# Patient Record
Sex: Male | Born: 1950 | Race: White | Hispanic: No | Marital: Married | Smoking: Former smoker
Health system: Southern US, Community
[De-identification: ages and names within clinical notes are randomized; demographics above are authoritative.]

---

## 1998-08-28 ENCOUNTER — Ambulatory Visit: Admission: RE | Admit: 1998-08-28 | Discharge: 1998-08-28 | Payer: Self-pay | Admitting: Occupational Medicine

## 1998-08-28 ENCOUNTER — Encounter: Payer: Self-pay | Admitting: Occupational Medicine

## 2010-07-11 ENCOUNTER — Emergency Department (HOSPITAL_COMMUNITY): Payer: Worker's Compensation

## 2010-07-11 ENCOUNTER — Emergency Department (HOSPITAL_COMMUNITY)
Admission: EM | Admit: 2010-07-11 | Discharge: 2010-07-11 | Disposition: A | Discharge status: Home / Self Care | Payer: Worker's Compensation | Attending: Emergency Medicine | Admitting: Emergency Medicine

## 2010-07-11 DIAGNOSIS — M795 Residual foreign body in soft tissue: Secondary | ICD-10-CM | POA: Insufficient documentation

## 2010-07-11 DIAGNOSIS — Z1810 Retained metal fragments, unspecified: Secondary | ICD-10-CM | POA: Insufficient documentation

## 2010-07-11 DIAGNOSIS — Z79899 Other long term (current) drug therapy: Secondary | ICD-10-CM | POA: Insufficient documentation

## 2010-07-11 DIAGNOSIS — M25569 Pain in unspecified knee: Secondary | ICD-10-CM | POA: Insufficient documentation

## 2010-07-11 DIAGNOSIS — E119 Type 2 diabetes mellitus without complications: Secondary | ICD-10-CM | POA: Insufficient documentation

## 2010-07-11 DIAGNOSIS — M25469 Effusion, unspecified knee: Secondary | ICD-10-CM | POA: Insufficient documentation

## 2010-07-11 LAB — CBC
HCT: 44 % (ref 39.0–52.0)
Hemoglobin: 15.1 g/dL (ref 13.0–17.0)
MCH: 31.7 pg (ref 26.0–34.0)
MCHC: 34.3 g/dL (ref 30.0–36.0)
RBC: 4.76 MIL/uL (ref 4.22–5.81)

## 2010-07-11 LAB — DIFFERENTIAL
Basophils Absolute: 0.1 10*3/uL (ref 0.0–0.1)
Basophils Relative: 0 % (ref 0–1)
Eosinophils Absolute: 0.2 10*3/uL (ref 0.0–0.7)
Eosinophils Relative: 1 % (ref 0–5)
Lymphocytes Relative: 20 % (ref 12–46)
Lymphs Abs: 2.8 10*3/uL (ref 0.7–4.0)
Monocytes Absolute: 1.3 10*3/uL — ABNORMAL HIGH (ref 0.1–1.0)
Monocytes Relative: 9 % (ref 3–12)
Neutro Abs: 10 10*3/uL — ABNORMAL HIGH (ref 1.7–7.7)
Neutrophils Relative %: 70 % (ref 43–77)

## 2010-07-11 LAB — POCT I-STAT, CHEM 8
BUN: 21 mg/dL (ref 6–23)
Calcium, Ion: 1.21 mmol/L (ref 1.12–1.32)
Chloride: 107 mEq/L (ref 96–112)
Creatinine, Ser: 1.1 mg/dL (ref 0.4–1.5)
Glucose, Bld: 134 mg/dL — ABNORMAL HIGH (ref 70–99)
HCT: 46 % (ref 39.0–52.0)
Hemoglobin: 15.6 g/dL (ref 13.0–17.0)
Potassium: 4.2 mEq/L (ref 3.5–5.1)
Sodium: 141 mEq/L (ref 135–145)
TCO2: 24 mmol/L (ref 0–100)

## 2010-07-24 NOTE — H&P (Signed)
  NAME:  VERNA, HAMON NO.:  [id number]  MEDICAL RECORD NO.:  [id number]           PATIENT TYPE:  E  LOCATION:  [facility]                         FACILITY:  [facility]  PHYSICIAN:  Burnard Bunting, M.D.    DATE OF BIRTH:  06/30/1950  DATE OF ADMISSION:  07/11/2010 DATE OF DISCHARGE:  07/11/2010                             HISTORY & PHYSICAL   REQUESTING PHYSICIAN:  Devoria Albe, MD  CHIEF COMPLAINT:  Right knee pain.  HISTORY OF PRESENT ILLNESS:  Steven Perry is a 60 year old patient who was at work when he was working on hammering a ball bearing and a piece of metal pop off and flew into his distal thigh on the right-hand side. He has been having some mild pain.  He is able to weight bear.  His current medications are Lipitor, metformin.  He is allergic to ASPIRIN. All other systems are reviewed and are negative as they relate to the right knee.  The patient is nonsmoker.  Occasionally drinks.  PHYSICAL EXAMINATION:  GENERAL:  He is well developed and well nourished, in acute distress.  Alert and oriented.  Answers questions appropriately. VITAL SIGNS:  Temperature 98.3, blood pressure 151/82, respirations 16. EXTREMITIES:  Right knee is examined.  There is no effusion or claudication.  Ligaments are stable.  There is an entry wound over the VMO about 4 cm proximal to the tip of the patella.  The radiographs 2-views right knee reviewed foreign body is about 6-7 cm proximal to the superior pole of patella.  Pedal pulses are palpable. Ankle dorsiflexion and plantar flexion is intact.  IMPRESSION:  Foreign body lodged in soft tissue.  Does not appear to have gone through the knee joint.  There is no effusion in the knee joint.  He does have a hematoma around the entry wound.  He is in a bit of risk of infection, especially with diabetes.  Plan at this time is for observation.  I am going to wrap the knee with compression, ice it down 20 minutes 3 times a day.  As it had  been said that he is allergic to aspirin, we will hold off on the anti-inflammatories, put him instead on pain medicine and muscle relaxers.  Out of work for a week until we see him back in our [facility] with Dr. Ophelia Charter.  All questions answered.     Burnard Bunting, M.D.     GSD/MEDQ  D:  07/11/2010  T:  07/12/2010  Job:  [id number]  Electronically Signed by Reece Agar.  Ananiah Maciolek M.D. on 07/24/2010 03:20:28 PM

## 2017-09-14 DIAGNOSIS — N183 Chronic kidney disease, stage 3 (moderate): Secondary | ICD-10-CM | POA: Diagnosis not present

## 2017-09-14 DIAGNOSIS — Z720 Tobacco use: Secondary | ICD-10-CM | POA: Diagnosis not present

## 2017-09-14 DIAGNOSIS — E1129 Type 2 diabetes mellitus with other diabetic kidney complication: Secondary | ICD-10-CM | POA: Diagnosis not present

## 2017-09-14 DIAGNOSIS — E1122 Type 2 diabetes mellitus with diabetic chronic kidney disease: Secondary | ICD-10-CM | POA: Diagnosis not present

## 2017-09-14 DIAGNOSIS — E782 Mixed hyperlipidemia: Secondary | ICD-10-CM | POA: Diagnosis not present

## 2017-10-01 DIAGNOSIS — L578 Other skin changes due to chronic exposure to nonionizing radiation: Secondary | ICD-10-CM | POA: Diagnosis not present

## 2017-10-01 DIAGNOSIS — L821 Other seborrheic keratosis: Secondary | ICD-10-CM | POA: Diagnosis not present

## 2017-10-01 DIAGNOSIS — L570 Actinic keratosis: Secondary | ICD-10-CM | POA: Diagnosis not present

## 2017-10-01 DIAGNOSIS — C44212 Basal cell carcinoma of skin of right ear and external auricular canal: Secondary | ICD-10-CM | POA: Diagnosis not present

## 2017-11-27 DIAGNOSIS — Z23 Encounter for immunization: Secondary | ICD-10-CM | POA: Diagnosis not present

## 2017-12-17 DIAGNOSIS — Z79899 Other long term (current) drug therapy: Secondary | ICD-10-CM | POA: Diagnosis not present

## 2017-12-17 DIAGNOSIS — M19042 Primary osteoarthritis, left hand: Secondary | ICD-10-CM | POA: Diagnosis not present

## 2017-12-17 DIAGNOSIS — M19041 Primary osteoarthritis, right hand: Secondary | ICD-10-CM | POA: Diagnosis not present

## 2017-12-17 DIAGNOSIS — E782 Mixed hyperlipidemia: Secondary | ICD-10-CM | POA: Diagnosis not present

## 2017-12-17 DIAGNOSIS — Z23 Encounter for immunization: Secondary | ICD-10-CM | POA: Diagnosis not present

## 2017-12-17 DIAGNOSIS — E1121 Type 2 diabetes mellitus with diabetic nephropathy: Secondary | ICD-10-CM | POA: Diagnosis not present

## 2017-12-17 DIAGNOSIS — N183 Chronic kidney disease, stage 3 (moderate): Secondary | ICD-10-CM | POA: Diagnosis not present

## 2018-03-25 DIAGNOSIS — E782 Mixed hyperlipidemia: Secondary | ICD-10-CM | POA: Diagnosis not present

## 2018-03-25 DIAGNOSIS — E1121 Type 2 diabetes mellitus with diabetic nephropathy: Secondary | ICD-10-CM | POA: Diagnosis not present

## 2018-03-25 DIAGNOSIS — N183 Chronic kidney disease, stage 3 (moderate): Secondary | ICD-10-CM | POA: Diagnosis not present

## 2018-03-25 DIAGNOSIS — J449 Chronic obstructive pulmonary disease, unspecified: Secondary | ICD-10-CM | POA: Diagnosis not present

## 2018-03-25 DIAGNOSIS — Z0000 Encounter for general adult medical examination without abnormal findings: Secondary | ICD-10-CM | POA: Diagnosis not present

## 2018-03-25 DIAGNOSIS — Z79899 Other long term (current) drug therapy: Secondary | ICD-10-CM | POA: Diagnosis not present

## 2018-04-10 DIAGNOSIS — N183 Chronic kidney disease, stage 3 (moderate): Secondary | ICD-10-CM | POA: Diagnosis not present

## 2018-04-10 DIAGNOSIS — J449 Chronic obstructive pulmonary disease, unspecified: Secondary | ICD-10-CM | POA: Diagnosis not present

## 2018-04-10 DIAGNOSIS — E1121 Type 2 diabetes mellitus with diabetic nephropathy: Secondary | ICD-10-CM | POA: Diagnosis not present

## 2018-05-11 DIAGNOSIS — N183 Chronic kidney disease, stage 3 (moderate): Secondary | ICD-10-CM | POA: Diagnosis not present

## 2018-05-11 DIAGNOSIS — E782 Mixed hyperlipidemia: Secondary | ICD-10-CM | POA: Diagnosis not present

## 2018-06-08 DIAGNOSIS — N183 Chronic kidney disease, stage 3 (moderate): Secondary | ICD-10-CM | POA: Diagnosis not present

## 2018-06-08 DIAGNOSIS — I129 Hypertensive chronic kidney disease with stage 1 through stage 4 chronic kidney disease, or unspecified chronic kidney disease: Secondary | ICD-10-CM | POA: Diagnosis not present

## 2018-06-08 DIAGNOSIS — Z720 Tobacco use: Secondary | ICD-10-CM | POA: Diagnosis not present

## 2018-06-08 DIAGNOSIS — E1122 Type 2 diabetes mellitus with diabetic chronic kidney disease: Secondary | ICD-10-CM | POA: Diagnosis not present

## 2018-06-08 DIAGNOSIS — E782 Mixed hyperlipidemia: Secondary | ICD-10-CM | POA: Diagnosis not present

## 2018-07-10 DIAGNOSIS — F419 Anxiety disorder, unspecified: Secondary | ICD-10-CM | POA: Diagnosis not present

## 2018-07-10 DIAGNOSIS — E782 Mixed hyperlipidemia: Secondary | ICD-10-CM | POA: Diagnosis not present

## 2018-10-26 DIAGNOSIS — Z23 Encounter for immunization: Secondary | ICD-10-CM | POA: Diagnosis not present

## 2018-11-17 DIAGNOSIS — N183 Chronic kidney disease, stage 3 unspecified: Secondary | ICD-10-CM | POA: Diagnosis not present

## 2018-11-17 DIAGNOSIS — Z79899 Other long term (current) drug therapy: Secondary | ICD-10-CM | POA: Diagnosis not present

## 2018-11-17 DIAGNOSIS — E1121 Type 2 diabetes mellitus with diabetic nephropathy: Secondary | ICD-10-CM | POA: Diagnosis not present

## 2018-11-17 DIAGNOSIS — E782 Mixed hyperlipidemia: Secondary | ICD-10-CM | POA: Diagnosis not present

## 2018-11-17 DIAGNOSIS — Z6824 Body mass index (BMI) 24.0-24.9, adult: Secondary | ICD-10-CM | POA: Diagnosis not present

## 2018-11-17 DIAGNOSIS — I739 Peripheral vascular disease, unspecified: Secondary | ICD-10-CM | POA: Diagnosis not present

## 2019-01-18 DIAGNOSIS — E1122 Type 2 diabetes mellitus with diabetic chronic kidney disease: Secondary | ICD-10-CM | POA: Diagnosis not present

## 2019-01-18 DIAGNOSIS — E782 Mixed hyperlipidemia: Secondary | ICD-10-CM | POA: Diagnosis not present

## 2019-01-18 DIAGNOSIS — I129 Hypertensive chronic kidney disease with stage 1 through stage 4 chronic kidney disease, or unspecified chronic kidney disease: Secondary | ICD-10-CM | POA: Diagnosis not present

## 2019-01-18 DIAGNOSIS — N183 Chronic kidney disease, stage 3 unspecified: Secondary | ICD-10-CM | POA: Diagnosis not present

## 2019-01-18 DIAGNOSIS — Z720 Tobacco use: Secondary | ICD-10-CM | POA: Diagnosis not present

## 2019-04-18 DIAGNOSIS — Z0000 Encounter for general adult medical examination without abnormal findings: Secondary | ICD-10-CM | POA: Diagnosis not present

## 2019-04-18 DIAGNOSIS — F419 Anxiety disorder, unspecified: Secondary | ICD-10-CM | POA: Diagnosis not present

## 2019-04-18 DIAGNOSIS — E782 Mixed hyperlipidemia: Secondary | ICD-10-CM | POA: Diagnosis not present

## 2019-04-18 DIAGNOSIS — J449 Chronic obstructive pulmonary disease, unspecified: Secondary | ICD-10-CM | POA: Diagnosis not present

## 2019-04-18 DIAGNOSIS — N183 Chronic kidney disease, stage 3 unspecified: Secondary | ICD-10-CM | POA: Diagnosis not present

## 2019-04-18 DIAGNOSIS — E1121 Type 2 diabetes mellitus with diabetic nephropathy: Secondary | ICD-10-CM | POA: Diagnosis not present

## 2019-04-18 DIAGNOSIS — Z79899 Other long term (current) drug therapy: Secondary | ICD-10-CM | POA: Diagnosis not present

## 2019-04-21 DIAGNOSIS — Z23 Encounter for immunization: Secondary | ICD-10-CM | POA: Diagnosis not present

## 2019-05-26 DIAGNOSIS — Z23 Encounter for immunization: Secondary | ICD-10-CM | POA: Diagnosis not present

## 2019-07-22 DIAGNOSIS — J209 Acute bronchitis, unspecified: Secondary | ICD-10-CM | POA: Diagnosis not present

## 2019-09-27 DIAGNOSIS — N183 Chronic kidney disease, stage 3 unspecified: Secondary | ICD-10-CM | POA: Diagnosis not present

## 2019-09-27 DIAGNOSIS — E782 Mixed hyperlipidemia: Secondary | ICD-10-CM | POA: Diagnosis not present

## 2019-09-27 DIAGNOSIS — Z79899 Other long term (current) drug therapy: Secondary | ICD-10-CM | POA: Diagnosis not present

## 2019-09-27 DIAGNOSIS — J449 Chronic obstructive pulmonary disease, unspecified: Secondary | ICD-10-CM | POA: Diagnosis not present

## 2019-09-27 DIAGNOSIS — E1121 Type 2 diabetes mellitus with diabetic nephropathy: Secondary | ICD-10-CM | POA: Diagnosis not present

## 2019-10-18 DIAGNOSIS — N183 Chronic kidney disease, stage 3 unspecified: Secondary | ICD-10-CM | POA: Diagnosis not present

## 2019-10-18 DIAGNOSIS — E1129 Type 2 diabetes mellitus with other diabetic kidney complication: Secondary | ICD-10-CM | POA: Diagnosis not present

## 2019-10-18 DIAGNOSIS — E1122 Type 2 diabetes mellitus with diabetic chronic kidney disease: Secondary | ICD-10-CM | POA: Diagnosis not present

## 2019-10-18 DIAGNOSIS — Z720 Tobacco use: Secondary | ICD-10-CM | POA: Diagnosis not present

## 2019-10-18 DIAGNOSIS — I129 Hypertensive chronic kidney disease with stage 1 through stage 4 chronic kidney disease, or unspecified chronic kidney disease: Secondary | ICD-10-CM | POA: Diagnosis not present

## 2019-10-18 DIAGNOSIS — E782 Mixed hyperlipidemia: Secondary | ICD-10-CM | POA: Diagnosis not present

## 2019-10-18 DIAGNOSIS — R300 Dysuria: Secondary | ICD-10-CM | POA: Diagnosis not present

## 2019-10-18 DIAGNOSIS — Z87442 Personal history of urinary calculi: Secondary | ICD-10-CM | POA: Diagnosis not present

## 2019-11-15 DIAGNOSIS — Z23 Encounter for immunization: Secondary | ICD-10-CM | POA: Diagnosis not present

## 2020-01-18 DIAGNOSIS — Z23 Encounter for immunization: Secondary | ICD-10-CM | POA: Diagnosis not present

## 2020-03-20 DIAGNOSIS — Z79899 Other long term (current) drug therapy: Secondary | ICD-10-CM | POA: Diagnosis not present

## 2020-03-20 DIAGNOSIS — E782 Mixed hyperlipidemia: Secondary | ICD-10-CM | POA: Diagnosis not present

## 2020-03-20 DIAGNOSIS — E1121 Type 2 diabetes mellitus with diabetic nephropathy: Secondary | ICD-10-CM | POA: Diagnosis not present

## 2020-03-20 DIAGNOSIS — Z125 Encounter for screening for malignant neoplasm of prostate: Secondary | ICD-10-CM | POA: Diagnosis not present

## 2020-04-19 DIAGNOSIS — E1121 Type 2 diabetes mellitus with diabetic nephropathy: Secondary | ICD-10-CM | POA: Diagnosis not present

## 2020-04-19 DIAGNOSIS — R6889 Other general symptoms and signs: Secondary | ICD-10-CM | POA: Diagnosis not present

## 2020-04-19 DIAGNOSIS — J449 Chronic obstructive pulmonary disease, unspecified: Secondary | ICD-10-CM | POA: Diagnosis not present

## 2020-04-19 DIAGNOSIS — I739 Peripheral vascular disease, unspecified: Secondary | ICD-10-CM | POA: Diagnosis not present

## 2020-04-19 DIAGNOSIS — Z0000 Encounter for general adult medical examination without abnormal findings: Secondary | ICD-10-CM | POA: Diagnosis not present

## 2020-05-09 ENCOUNTER — Other Ambulatory Visit: Payer: Self-pay

## 2020-05-09 ENCOUNTER — Ambulatory Visit (INDEPENDENT_AMBULATORY_CARE_PROVIDER_SITE_OTHER): Payer: Medicare Other | Admitting: Cardiovascular Disease

## 2020-05-09 ENCOUNTER — Encounter: Payer: Self-pay | Admitting: Cardiovascular Disease

## 2020-05-09 DIAGNOSIS — Z720 Tobacco use: Secondary | ICD-10-CM | POA: Diagnosis not present

## 2020-05-09 DIAGNOSIS — I739 Peripheral vascular disease, unspecified: Secondary | ICD-10-CM

## 2020-05-09 DIAGNOSIS — E782 Mixed hyperlipidemia: Secondary | ICD-10-CM

## 2020-05-09 DIAGNOSIS — E119 Type 2 diabetes mellitus without complications: Secondary | ICD-10-CM | POA: Insufficient documentation

## 2020-05-09 DIAGNOSIS — I10 Essential (primary) hypertension: Secondary | ICD-10-CM | POA: Diagnosis not present

## 2020-05-09 DIAGNOSIS — E785 Hyperlipidemia, unspecified: Secondary | ICD-10-CM | POA: Insufficient documentation

## 2020-05-09 NOTE — Assessment & Plan Note (Signed)
Patient was referred by Dr. Humphrey Rolls for lifestyle limiting claudication which began 2 to 3 years ago.  Left is worse than right.  We will get lower extremity arterial Doppler studies to further evaluate.

## 2020-05-09 NOTE — Progress Notes (Signed)
05/09/2020 Steven Perry   1950-07-04  [id number]  Primary Physician Mateo Flow, MD Primary Cardiologist: Lorretta Harp MD [name], [name]  HPI:  Steven Perry is a 69 y.o. thin appearing married Caucasian male father of 2 children, grandfather of 2 grandchildren referred by Dr.Jaber Humphrey Rolls in [place] for evaluation treatment of lifestyle limiting claudication.  Patient is accompanied by his wife Steven Perry today.  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  History of hypertension, diabetes and hyperlipidemia.  His mother had stents in her 53s and his brother has had stents as well.  He is never had a heart attack or stroke.  He denies chest pain but does get shortness of breath because of COPD.  He has a serum creatinine that runs in the 1.8 range.  He has had left greater than right lower extremity lifestyle limiting claudication for the last 2 years which is lifestyle limiting.   Current Meds  Medication Sig  . ALPRAZolam (XANAX) 0.5 MG tablet Take 1 tablet by mouth daily.  . budesonide-formoterol (SYMBICORT) 160-4.5 MCG/ACT inhaler Inhale 2 puffs into the lungs 2 (two) times daily.  [name] glimepiride (AMARYL) 2 MG tablet Take 1 tablet by mouth daily.  [name] lisinopril (ZESTRIL) 5 MG tablet Take 1 tablet by mouth daily.  . pioglitazone (ACTOS) 30 MG tablet Take 1 tablet by mouth daily.  . QUEtiapine (SEROQUEL) 50 MG tablet Take 1 tablet by mouth as directed.  . rosuvastatin (CRESTOR) 10 MG tablet Take 1 tablet by mouth daily.  . traMADol-acetaminophen (ULTRACET) 37.5-325 MG tablet Take 1 tablet by mouth every 6 (six) hours as needed.  . [DISCONTINUED] cilostazol (PLETAL) 100 MG tablet Take 100 mg by mouth 2 (two) times daily.     Allergies  Allergen Reactions  . Ibuprofen Itching  . Nsaids Other (See Comments)    Renal insufficiency  . Statins Rash    Social History   Socioeconomic  History  . Marital status: Married    Spouse name: Not on file  . Number of children: Not on file  . Years of education: Not on file  . Highest education level: Not on file  Occupational History  . Not on file  Tobacco Use  . Smoking status: Current Every Day Smoker  . Smokeless tobacco: Never Used  Substance and Sexual Activity  . Alcohol use: Not on file  . Drug use: Not on file  . Sexual activity: Not on file  Other Topics Concern  . Not on file  Social History Narrative  . Not on file   Social Determinants of Health   Financial Resource Strain: Not on file  Food Insecurity: Not on file  Transportation Needs: Not on file  Physical Activity: Not on file  Stress: Not on file  Social Connections: Not on file  Intimate Partner Violence: Not on file     Review of Systems: General: negative for chills, fever, night sweats or weight changes.  Cardiovascular: negative for chest pain, dyspnea on exertion, edema, orthopnea, palpitations, paroxysmal nocturnal dyspnea or shortness of breath Dermatological: negative for rash Respiratory: negative for cough or wheezing Urologic: negative for hematuria Abdominal: negative for nausea, vomiting, diarrhea, bright red blood per rectum, melena, or hematemesis Neurologic: negative for visual changes, syncope, or dizziness All other systems reviewed and are otherwise negative except as noted above.    Blood pressure 136/64,  pulse 74, height 5\' 8"  (1.727 m), weight 159 lb (72.1 kg).  General appearance: alert and no distress Neck: no adenopathy, no carotid bruit, no JVD, supple, symmetrical, trachea midline and thyroid not enlarged, symmetric, no tenderness/mass/nodules Lungs: clear to auscultation bilaterally Heart: regular rate and rhythm, S1, S2 normal, no murmur, click, rub or gallop Extremities: extremities normal, atraumatic, no cyanosis or edema Pulses: Diminished pedal pulses Skin: Skin color, texture, turgor normal. No rashes  or lesions Neurologic: Alert and oriented X 3, normal strength and tone. Normal symmetric reflexes. Normal coordination and gait  EKG normal sinus rhythm at 74 with septal Q waves.  I personally reviewed this EKG.  ASSESSMENT AND PLAN:   Tobacco abuse History of ongoing tobacco abuse 1/2 pack/day for last 50 years.  Essential hypertension History of essential hypertension blood pressure measured today 136/64.  He is on lisinopril.  Hyperlipidemia History of hyperlipidemia on statin therapy with lipid profile performed by his PCP 03/26/2020 revealing total cluster of 235, LDL of 158 and HDL of 38.  Peripheral arterial disease ([place]) Patient was referred by Dr. Humphrey Rolls for lifestyle limiting claudication which began 2 to 3 years ago.  Left is worse than right.  We will get lower extremity arterial Doppler studies to further evaluate.      Lorretta Harp MD FACP,FACC,FAHA, [facility] 05/09/2020 3:42 PM

## 2020-05-09 NOTE — Assessment & Plan Note (Signed)
History of ongoing tobacco abuse 1/2 pack/day for last 50 years.

## 2020-05-09 NOTE — Assessment & Plan Note (Signed)
History of essential hypertension blood pressure measured today 136/64.  He is on lisinopril.

## 2020-05-09 NOTE — Assessment & Plan Note (Signed)
History of hyperlipidemia on statin therapy with lipid profile performed by his PCP 03/26/2020 revealing total cluster of 235, LDL of 158 and HDL of 38.

## 2020-05-09 NOTE — Patient Instructions (Addendum)
Medication Instructions:  Your physician recommends that you continue on your current medications as directed. Please refer to the Current Medication list given to you today.  *If you need a refill on your cardiac medications before your next appointment, please call your pharmacy*   Testing/Procedures: Your physician has requested that you have a lower extremity arterial duplex. This test is an ultrasound of the arteries in the legs. It looks at arterial blood flow in the legs. Allow one hour for Lower Arterial scans. There are no restrictions or special instructions  Your physician has requested that you have an abdominal aorta duplex. During this test, an ultrasound is used to evaluate the aorta. Allow 30 minutes for this exam. Do not eat after midnight the day before and avoid carbonated beverages  Your physician has requested that you have an ankle brachial index (ABI). During this test an ultrasound and blood pressure cuff are used to evaluate the arteries that supply the arms and legs with blood. Allow thirty minutes for this exam. There are no restrictions or special instructions.  These procedures are done at [facility]. 2nd Floor.    Follow-Up: At [facility], you and your health needs are our priority.  As part of our continuing mission to provide you with exceptional heart care, we have created designated Provider Care Teams.  These Care Teams include your primary Cardiologist (physician) and Advanced Practice Providers (APPs -  Physician Assistants and Nurse Practitioners) who all work together to provide you with the care you need, when you need it.  We recommend signing up for the patient portal called "MyChart".  Sign up information is provided on this After Visit Summary.  MyChart is used to connect with patients for Virtual Visits (Telemedicine).  Patients are able to view lab/test results, encounter notes, upcoming appointments, etc.  Non-urgent messages can be sent  to your provider as well.   To learn more about what you can do with MyChart, go to [email or web address].    Your next appointment:   1 month(s)  The format for your next appointment:   In Person  Provider:   [name], MD

## 2020-05-14 DIAGNOSIS — Z1211 Encounter for screening for malignant neoplasm of colon: Secondary | ICD-10-CM | POA: Diagnosis not present

## 2020-05-14 DIAGNOSIS — Z1212 Encounter for screening for malignant neoplasm of rectum: Secondary | ICD-10-CM | POA: Diagnosis not present

## 2020-05-28 ENCOUNTER — Ambulatory Visit (HOSPITAL_BASED_OUTPATIENT_CLINIC_OR_DEPARTMENT_OTHER)
Admission: RE | Admit: 2020-05-28 | Discharge: 2020-05-28 | Disposition: A | Discharge status: Home / Self Care | Payer: Medicare Other | Source: Ambulatory Visit | Attending: Cardiovascular Disease | Admitting: Cardiovascular Disease

## 2020-05-28 ENCOUNTER — Ambulatory Visit (HOSPITAL_COMMUNITY)
Admission: RE | Admit: 2020-05-28 | Discharge: 2020-05-28 | Disposition: A | Discharge status: Home / Self Care | Payer: Medicare Other | Source: Ambulatory Visit | Attending: Cardiovascular Disease | Admitting: Cardiovascular Disease

## 2020-05-28 ENCOUNTER — Other Ambulatory Visit: Payer: Self-pay | Admitting: Cardiovascular Disease

## 2020-05-28 ENCOUNTER — Other Ambulatory Visit: Payer: Self-pay

## 2020-05-28 DIAGNOSIS — M79605 Pain in left leg: Secondary | ICD-10-CM

## 2020-05-28 DIAGNOSIS — I739 Peripheral vascular disease, unspecified: Secondary | ICD-10-CM | POA: Insufficient documentation

## 2020-05-28 DIAGNOSIS — M79604 Pain in right leg: Secondary | ICD-10-CM

## 2020-06-08 ENCOUNTER — Encounter: Payer: Self-pay | Admitting: Cardiovascular Disease

## 2020-06-08 ENCOUNTER — Ambulatory Visit (INDEPENDENT_AMBULATORY_CARE_PROVIDER_SITE_OTHER): Payer: Medicare Other | Admitting: Cardiovascular Disease

## 2020-06-08 ENCOUNTER — Other Ambulatory Visit: Payer: Self-pay

## 2020-06-08 DIAGNOSIS — I739 Peripheral vascular disease, unspecified: Secondary | ICD-10-CM | POA: Diagnosis not present

## 2020-06-08 DIAGNOSIS — Z720 Tobacco use: Secondary | ICD-10-CM | POA: Diagnosis not present

## 2020-06-08 DIAGNOSIS — E782 Mixed hyperlipidemia: Secondary | ICD-10-CM

## 2020-06-08 DIAGNOSIS — I10 Essential (primary) hypertension: Secondary | ICD-10-CM

## 2020-06-08 LAB — LIPID PANEL
Chol/HDL Ratio: 4.1 ratio (ref 0.0–5.0)
Cholesterol, Total: 172 mg/dL (ref 100–199)
HDL: 42 mg/dL (ref >39)
LDL Chol Calc (NIH): 107 mg/dL — ABNORMAL HIGH (ref 0–99)
Triglycerides: 128 mg/dL (ref 0–149)
VLDL Cholesterol Cal: 23 mg/dL (ref 5–40)

## 2020-06-08 LAB — CBC
Hematocrit: 42.8 % (ref 37.5–51.0)
Hemoglobin: 14.2 g/dL (ref 13.0–17.7)
MCH: 31.1 pg (ref 26.6–33.0)
MCHC: 33.2 g/dL (ref 31.5–35.7)
MCV: 94 fL (ref 79–97)
Platelets: 293 10*3/uL (ref 150–450)
RBC: 4.56 x10E6/uL (ref 4.14–5.80)
RDW: 12.8 % (ref 11.6–15.4)
WBC: 10.5 10*3/uL (ref 3.4–10.8)

## 2020-06-08 LAB — BASIC METABOLIC PANEL
BUN/Creatinine Ratio: 11 (ref 10–24)
BUN: 21 mg/dL (ref 8–27)
CO2: 23 mmol/L (ref 20–29)
Calcium: 9.1 mg/dL (ref 8.6–10.2)
Chloride: 102 mmol/L (ref 96–106)
Creatinine, Ser: 1.92 mg/dL — ABNORMAL HIGH (ref 0.76–1.27)
Glucose: 153 mg/dL — ABNORMAL HIGH (ref 65–99)
Potassium: 4.4 mmol/L (ref 3.5–5.2)
Sodium: 138 mmol/L (ref 134–144)
eGFR: 37 mL/min/{1.73_m2} — ABNORMAL LOW (ref >59)

## 2020-06-08 LAB — HEPATIC FUNCTION PANEL
ALT: 14 IU/L (ref 0–44)
AST: 14 IU/L (ref 0–40)
Albumin: 4.4 g/dL (ref 3.8–4.8)
Alkaline Phosphatase: 73 IU/L (ref 44–121)
Bilirubin Total: 0.2 mg/dL (ref 0.0–1.2)
Bilirubin, Direct: <0.1 mg/dL (ref 0.00–0.40)
Total Protein: 7.1 g/dL (ref 6.0–8.5)

## 2020-06-08 MED ORDER — SODIUM CHLORIDE 0.9% FLUSH: 3.0000 mL | Freq: Two times a day (BID) | INTRAVENOUS | Status: AC

## 2020-06-08 NOTE — Assessment & Plan Note (Signed)
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  His ABI was 0.99 on the right and 0.69 on the left.  He will need peripheral angiography and intervention.

## 2020-06-08 NOTE — Patient Instructions (Addendum)
Steven Perry [place] [address] Dept: [phone] Loc: [place]  06/08/2020  You are scheduled for a Peripheral Angiogram on Thursday, May 5 with Dr. [name].  1. Please arrive at the [facility] (Main Entrance A) at [facility]: [address] [place], [place] [address] at 5:30 AM (This time is two hours before your procedure to ensure your preparation). Free valet parking service is available.   Special note: Every effort is made to have your procedure done on time. Please understand that emergencies sometimes delay scheduled procedures.  2. Diet: Do not eat solid foods after midnight.  The patient may have clear liquids until 5am upon the day of the procedure.  3. Labs: You will need to have blood drawn today (06/08/20).  4. Medication instructions in preparation for your procedure:  -Stop taking Lisinopril 3 days prior to procedure, last dose Sunday, May 1  On the morning of your procedure, take your Aspirin and any morning medicines NOT listed above.  You may use sips of water.  5. Plan for one night stay--bring personal belongings. 6. Bring a current list of your medications and current insurance cards. 7. You MUST have a responsible person to drive you home. 8. Someone MUST be with you the first 24 hours after you arrive home or your discharge will be delayed. 9. Please wear clothes that are easy to get on and off and wear slip-on shoes.  Thank you for allowing [place] to care for you!   --  Invasive Cardiovascular services  You will need aorta/IVC/Iliac, Lower extremity and ABIs 1 week after procedure.  You will need a 2 week follow up with Dr. Gwenlyn Found.    Dr. Gwenlyn Found has ordered a CT coronary calcium score. This test is done at [address] 3rd Floor. This is $99 out of pocket.   Coronary CalciumScan A coronary  calcium scan is an imaging test used to look for deposits of calcium and other fatty materials (plaques) in the inner lining of the blood vessels of the heart (coronary arteries). These deposits of calcium and plaques can partly clog and narrow the coronary arteries without producing any symptoms or warning signs. This puts a person at risk for a heart attack. This test can detect these deposits before symptoms develop. Tell a health care provider about:  Any allergies you have.  All medicines you are taking, including vitamins, herbs, eye drops, creams, and over-the-counter medicines.  Any problems you or family members have had with anesthetic medicines.  Any blood disorders you have.  Any surgeries you have had.  Any medical conditions you have.  Whether you are pregnant or may be pregnant. What are the risks? Generally, this is a safe procedure. However, problems may occur, including:  Harm to a pregnant woman and her unborn baby. This test involves the use of radiation. Radiation exposure can be dangerous to a pregnant woman and her unborn baby. If you are pregnant, you generally should not have this procedure done.  Slight increase in the risk of cancer. This is because of the radiation involved in the test. What happens before the procedure? No preparation is needed for this procedure. What happens during the procedure?  You will undress and remove any jewelry around your neck or chest.  You will put on a hospital gown.  Sticky electrodes will be placed on your  chest. The electrodes will be connected to an electrocardiogram (ECG) machine to record a tracing of the electrical activity of your heart.  A CT scanner will take pictures of your heart. During this time, you will be asked to lie still and hold your breath for 2-3 seconds while a picture of your heart is being taken. The procedure may vary among health care providers and hospitals. What happens after the  procedure?  You can get dressed.  You can return to your normal activities.  It is up to you to get the results of your test. Ask your health care provider, or the department that is doing the test, when your results will be ready. Summary  A coronary calcium scan is an imaging test used to look for deposits of calcium and other fatty materials (plaques) in the inner lining of the blood vessels of the heart (coronary arteries).  Generally, this is a safe procedure. Tell your health care provider if you are pregnant or may be pregnant.  No preparation is needed for this procedure.  A CT scanner will take pictures of your heart.  You can return to your normal activities after the scan is done. This information is not intended to replace advice given to you by your health care provider. Make sure you discuss any questions you have with your health care provider. Document Released: 07/26/2007 Document Revised: 12/17/2015 Document Reviewed: 12/17/2015 Elsevier Interactive Patient Education  2017 [employer].

## 2020-06-08 NOTE — Progress Notes (Signed)
06/08/2020 Steven Perry   1950/07/08  [id number]  Primary Physician Steven Flow, MD Primary Cardiologist: Steven Harp MD Steven Perry, [name]  HPI:  Steven Perry is a 70 y.o.  thin appearing married Caucasian male father of 2 children, grandfather of 2 grandchildren referred by Dr.Jaber Humphrey Perry in [place] for evaluation treatment of lifestyle limiting claudication.  Patient is accompanied by his wife Steven Perry today.    I last saw him in the office 05/09/2020.  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  History of hypertension, diabetes and hyperlipidemia.  His mother had stents in her 79s and his brother has had stents as well.  He is never had a heart attack or stroke.  He denies chest pain but does get shortness of breath because of COPD.  He has a serum creatinine that runs in the 1.8 range.  He has had left greater than right lower extremity lifestyle limiting claudication for the last 2 years which is lifestyle limiting.  I performed lower extremity arterial Doppler studies on him 05/28/2020 revealing a right ABI of 0.99 and a left of 0.69.  He did have a high-frequency signal in his left common iliac artery.  He wishes to proceed with peripheral angiography and intervention for lifestyle limiting claudication.   Current Meds  Medication Sig  . ALPRAZolam (XANAX) 0.5 MG tablet Take 1 tablet by mouth daily.  . budesonide-formoterol (SYMBICORT) 160-4.5 MCG/ACT inhaler Inhale 2 puffs into the lungs 2 (two) times daily.  [name] glimepiride (AMARYL) 2 MG tablet Take 1 tablet by mouth daily.  [name] lisinopril (ZESTRIL) 5 MG tablet Take 1 tablet by mouth daily.  . pioglitazone (ACTOS) 30 MG tablet Take 1 tablet by mouth daily.  . QUEtiapine (SEROQUEL) 50 MG tablet Take 1 tablet by mouth as directed.  . rosuvastatin (CRESTOR) 10 MG tablet Take 1 tablet by mouth daily.  . traMADol-acetaminophen (ULTRACET)  37.5-325 MG tablet Take 1 tablet by mouth every 6 (six) hours as needed.     Allergies  Allergen Reactions  . Ibuprofen Itching  . Nsaids Other (See Comments)    Renal insufficiency  . Statins Rash    Social History   Socioeconomic History  . Marital status: Married    Spouse name: Not on file  . Number of children: Not on file  . Years of education: Not on file  . Highest education level: Not on file  Occupational History  . Not on file  Tobacco Use  . Smoking status: Current Every Day Smoker  . Smokeless tobacco: Never Used  Substance and Sexual Activity  . Alcohol use: Not on file  . Drug use: Not on file  . Sexual activity: Not on file  Other Topics Concern  . Not on file  Social History Narrative  . Not on file   Social Determinants of Health   Financial Resource Strain: Not on file  Food Insecurity: Not on file  Transportation Needs: Not on file  Physical Activity: Not on file  Stress: Not on file  Social Connections: Not on file  Intimate Partner Violence: Not on file     Review of Systems: General: negative for chills, fever, night sweats or weight changes.  Cardiovascular: negative for chest pain, dyspnea on exertion, edema, orthopnea, palpitations, paroxysmal nocturnal dyspnea or shortness of breath Dermatological: negative for rash Respiratory: negative for cough or wheezing  Urologic: negative for hematuria Abdominal: negative for nausea, vomiting, diarrhea, bright red blood per rectum, melena, or hematemesis Neurologic: negative for visual changes, syncope, or dizziness All other systems reviewed and are otherwise negative except as noted above.    Blood pressure (!) 150/61, pulse 71, height 5\' 8"  (1.727 m), weight 158 lb (71.7 kg), SpO2 97 %.  General appearance: alert and no distress Neck: no adenopathy, no carotid bruit, no JVD, supple, symmetrical, trachea midline and thyroid not enlarged, symmetric, no tenderness/mass/nodules Lungs: clear to  auscultation bilaterally Heart: regular rate and rhythm, S1, S2 normal, no murmur, click, rub or gallop Extremities: extremities normal, atraumatic, no cyanosis or edema Pulses: 2+ and symmetric Skin: Skin color, texture, turgor normal. No rashes or lesions Neurologic: Alert and oriented X 3, normal strength and tone. Normal symmetric reflexes. Normal coordination and gait  EKG not performed today  ASSESSMENT AND PLAN:   Tobacco abuse History of ongoing tobacco abuse recalcitrant to risk factor modification.  We had a long discussion about the importance of tobacco cessation.  Essential hypertension History of essential hypertension a blood pressure measured today 150/61.  He is on lisinopril.  Hyperlipidemia History of hyperlipidemia on low-dose rosuvastatin with lipid profile performed 03/20/2020 revealing total cholesterol 235.  He does admit to dietary indiscretion.  We will recheck a lipid liver profile.  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  His ABI was 0.99 on the right and 0.69 on the left.  He will need peripheral angiography and intervention.      Steven Harp MD FACP,FACC,FAHA, [facility] 06/08/2020 9:03 AM

## 2020-06-08 NOTE — Assessment & Plan Note (Signed)
History of essential hypertension a blood pressure measured today 150/61.  He is on lisinopril.

## 2020-06-08 NOTE — H&P (View-Only) (Signed)
06/08/2020 Steven Perry   1950/08/07  [id number]  Primary Physician Mateo Flow, MD Primary Cardiologist: Lorretta Harp MD [name], [name]  HPI:  Steven Perry is a 70 y.o.  thin appearing married Caucasian male father of 2 children, grandfather of 2 grandchildren referred by Dr.Jaber Humphrey Rolls in [place] for evaluation treatment of lifestyle limiting claudication.  Patient is accompanied by his wife Steven Perry today.    I last saw him in the office 05/09/2020.  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  History of hypertension, diabetes and hyperlipidemia.  His mother had stents in her 7s and his brother has had stents as well.  He is never had a heart attack or stroke.  He denies chest pain but does get shortness of breath because of COPD.  He has a serum creatinine that runs in the 1.8 range.  He has had left greater than right lower extremity lifestyle limiting claudication for the last 2 years which is lifestyle limiting.  I performed lower extremity arterial Doppler studies on him 05/28/2020 revealing a right ABI of 0.99 and a left of 0.69.  He did have a high-frequency signal in his left common iliac artery.  He wishes to proceed with peripheral angiography and intervention for lifestyle limiting claudication.   Current Meds  Medication Sig  . ALPRAZolam (XANAX) 0.5 MG tablet Take 1 tablet by mouth daily.  . budesonide-formoterol (SYMBICORT) 160-4.5 MCG/ACT inhaler Inhale 2 puffs into the lungs 2 (two) times daily.  [name] glimepiride (AMARYL) 2 MG tablet Take 1 tablet by mouth daily.  [name] lisinopril (ZESTRIL) 5 MG tablet Take 1 tablet by mouth daily.  . pioglitazone (ACTOS) 30 MG tablet Take 1 tablet by mouth daily.  . QUEtiapine (SEROQUEL) 50 MG tablet Take 1 tablet by mouth as directed.  . rosuvastatin (CRESTOR) 10 MG tablet Take 1 tablet by mouth daily.  . traMADol-acetaminophen (ULTRACET)  37.5-325 MG tablet Take 1 tablet by mouth every 6 (six) hours as needed.     Allergies  Allergen Reactions  . Ibuprofen Itching  . Nsaids Other (See Comments)    Renal insufficiency  . Statins Rash    Social History   Socioeconomic History  . Marital status: Married    Spouse name: Not on file  . Number of children: Not on file  . Years of education: Not on file  . Highest education level: Not on file  Occupational History  . Not on file  Tobacco Use  . Smoking status: Current Every Day Smoker  . Smokeless tobacco: Never Used  Substance and Sexual Activity  . Alcohol use: Not on file  . Drug use: Not on file  . Sexual activity: Not on file  Other Topics Concern  . Not on file  Social History Narrative  . Not on file   Social Determinants of Health   Financial Resource Strain: Not on file  Food Insecurity: Not on file  Transportation Needs: Not on file  Physical Activity: Not on file  Stress: Not on file  Social Connections: Not on file  Intimate Partner Violence: Not on file     Review of Systems: General: negative for chills, fever, night sweats or weight changes.  Cardiovascular: negative for chest pain, dyspnea on exertion, edema, orthopnea, palpitations, paroxysmal nocturnal dyspnea or shortness of breath Dermatological: negative for rash Respiratory: negative for cough or wheezing  Urologic: negative for hematuria Abdominal: negative for nausea, vomiting, diarrhea, bright red blood per rectum, melena, or hematemesis Neurologic: negative for visual changes, syncope, or dizziness All other systems reviewed and are otherwise negative except as noted above.    Blood pressure (!) 150/61, pulse 71, height 5\' 8"  (1.727 m), weight 158 lb (71.7 kg), SpO2 97 %.  General appearance: alert and no distress Neck: no adenopathy, no carotid bruit, no JVD, supple, symmetrical, trachea midline and thyroid not enlarged, symmetric, no tenderness/mass/nodules Lungs: clear to  auscultation bilaterally Heart: regular rate and rhythm, S1, S2 normal, no murmur, click, rub or gallop Extremities: extremities normal, atraumatic, no cyanosis or edema Pulses: 2+ and symmetric Skin: Skin color, texture, turgor normal. No rashes or lesions Neurologic: Alert and oriented X 3, normal strength and tone. Normal symmetric reflexes. Normal coordination and gait  EKG not performed today  ASSESSMENT AND PLAN:   Tobacco abuse History of ongoing tobacco abuse recalcitrant to risk factor modification.  We had a long discussion about the importance of tobacco cessation.  Essential hypertension History of essential hypertension a blood pressure measured today 150/61.  He is on lisinopril.  Hyperlipidemia History of hyperlipidemia on low-dose rosuvastatin with lipid profile performed 03/20/2020 revealing total cholesterol 235.  He does admit to dietary indiscretion.  We will recheck a lipid liver profile.  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  His ABI was 0.99 on the right and 0.69 on the left.  He will need peripheral angiography and intervention.      Lorretta Harp MD FACP,FACC,FAHA, [facility] 06/08/2020 9:03 AM

## 2020-06-08 NOTE — Assessment & Plan Note (Signed)
History of hyperlipidemia on low-dose rosuvastatin with lipid profile performed 03/20/2020 revealing total cholesterol 235.  He does admit to dietary indiscretion.  We will recheck a lipid liver profile.

## 2020-06-08 NOTE — Assessment & Plan Note (Signed)
History of ongoing tobacco abuse recalcitrant to risk factor modification.  We had a long discussion about the importance of tobacco cessation.

## 2020-06-12 ENCOUNTER — Other Ambulatory Visit (HOSPITAL_COMMUNITY)
Admission: RE | Admit: 2020-06-12 | Discharge: 2020-06-12 | Disposition: A | Discharge status: Home / Self Care | Payer: Medicare Other | Source: Ambulatory Visit | Attending: Cardiovascular Disease | Admitting: Cardiovascular Disease

## 2020-06-12 DIAGNOSIS — Z01812 Encounter for preprocedural laboratory examination: Secondary | ICD-10-CM | POA: Insufficient documentation

## 2020-06-12 DIAGNOSIS — Z20822 Contact with and (suspected) exposure to covid-19: Secondary | ICD-10-CM | POA: Diagnosis not present

## 2020-06-13 ENCOUNTER — Telehealth: Payer: Self-pay | Admitting: *Deleted

## 2020-06-13 LAB — SARS CORONAVIRUS 2 (TAT 6-24 HRS): SARS Coronavirus 2: NEGATIVE

## 2020-06-13 NOTE — Telephone Encounter (Signed)
Pt contacted pre-abdominal aortogram  scheduled at [facility] for: Thursday Jun 14, 2020 9:30 AM Verified arrival time and place: [facility]) at: 5:30 AM-pre-procedure hydration   No solid food after midnight prior to cath, clear liquids until 5 AM day of procedure.  Hold: Lisinopril-3 days prior to procedure Actos-AM of procedure Amaryl-AM of procedure  Except hold medications AM meds can be  taken pre-cath with sips of water including: ASA 81 mg-pt reports he does tolerate aspirin   Confirmed patient has responsible adult to drive home post procedure and be with patient first 24 hours after arriving home: yes  You are allowed ONE visitor in the waiting room during the time you are at the hospital for your procedure. Both you and your visitor must wear a mask once you enter the hospital.        Reviewed procedure/mask/visitor instructions with patient.

## 2020-06-14 ENCOUNTER — Encounter (HOSPITAL_COMMUNITY): Payer: Self-pay | Admitting: Cardiovascular Disease

## 2020-06-14 ENCOUNTER — Other Ambulatory Visit: Payer: Self-pay

## 2020-06-14 ENCOUNTER — Ambulatory Visit (HOSPITAL_COMMUNITY)
Admission: RE | Admit: 2020-06-14 | Discharge: 2020-06-15 | Disposition: A | Discharge status: Home / Self Care | Payer: Medicare Other | Attending: Cardiovascular Disease | Admitting: Cardiovascular Disease

## 2020-06-14 ENCOUNTER — Encounter (HOSPITAL_COMMUNITY)
Admission: RE | Disposition: A | Discharge status: Home / Self Care | Payer: Self-pay | Source: Home / Self Care | Attending: Cardiovascular Disease

## 2020-06-14 DIAGNOSIS — E1122 Type 2 diabetes mellitus with diabetic chronic kidney disease: Secondary | ICD-10-CM | POA: Insufficient documentation

## 2020-06-14 DIAGNOSIS — Z7902 Long term (current) use of antithrombotics/antiplatelets: Secondary | ICD-10-CM | POA: Insufficient documentation

## 2020-06-14 DIAGNOSIS — E1151 Type 2 diabetes mellitus with diabetic peripheral angiopathy without gangrene: Secondary | ICD-10-CM | POA: Insufficient documentation

## 2020-06-14 DIAGNOSIS — I70213 Atherosclerosis of native arteries of extremities with intermittent claudication, bilateral legs: Secondary | ICD-10-CM | POA: Diagnosis not present

## 2020-06-14 DIAGNOSIS — N1832 Chronic kidney disease, stage 3b: Secondary | ICD-10-CM | POA: Diagnosis not present

## 2020-06-14 DIAGNOSIS — Z7982 Long term (current) use of aspirin: Secondary | ICD-10-CM | POA: Diagnosis not present

## 2020-06-14 DIAGNOSIS — Z7951 Long term (current) use of inhaled steroids: Secondary | ICD-10-CM | POA: Diagnosis not present

## 2020-06-14 DIAGNOSIS — Z79899 Other long term (current) drug therapy: Secondary | ICD-10-CM | POA: Insufficient documentation

## 2020-06-14 DIAGNOSIS — F17210 Nicotine dependence, cigarettes, uncomplicated: Secondary | ICD-10-CM | POA: Insufficient documentation

## 2020-06-14 DIAGNOSIS — E785 Hyperlipidemia, unspecified: Secondary | ICD-10-CM | POA: Diagnosis not present

## 2020-06-14 DIAGNOSIS — Z886 Allergy status to analgesic agent status: Secondary | ICD-10-CM | POA: Diagnosis not present

## 2020-06-14 DIAGNOSIS — I129 Hypertensive chronic kidney disease with stage 1 through stage 4 chronic kidney disease, or unspecified chronic kidney disease: Secondary | ICD-10-CM | POA: Insufficient documentation

## 2020-06-14 DIAGNOSIS — Z888 Allergy status to other drugs, medicaments and biological substances status: Secondary | ICD-10-CM | POA: Diagnosis not present

## 2020-06-14 DIAGNOSIS — Z20822 Contact with and (suspected) exposure to covid-19: Secondary | ICD-10-CM | POA: Insufficient documentation

## 2020-06-14 DIAGNOSIS — Z7984 Long term (current) use of oral hypoglycemic drugs: Secondary | ICD-10-CM | POA: Insufficient documentation

## 2020-06-14 DIAGNOSIS — E119 Type 2 diabetes mellitus without complications: Secondary | ICD-10-CM

## 2020-06-14 DIAGNOSIS — I708 Atherosclerosis of other arteries: Secondary | ICD-10-CM | POA: Insufficient documentation

## 2020-06-14 DIAGNOSIS — J449 Chronic obstructive pulmonary disease, unspecified: Secondary | ICD-10-CM | POA: Insufficient documentation

## 2020-06-14 DIAGNOSIS — I10 Essential (primary) hypertension: Secondary | ICD-10-CM | POA: Diagnosis present

## 2020-06-14 DIAGNOSIS — Z720 Tobacco use: Secondary | ICD-10-CM | POA: Diagnosis present

## 2020-06-14 DIAGNOSIS — I739 Peripheral vascular disease, unspecified: Secondary | ICD-10-CM | POA: Diagnosis present

## 2020-06-14 HISTORY — PX: ABDOMINAL AORTOGRAM W/LOWER EXTREMITY: CATH118223

## 2020-06-14 HISTORY — PX: PERIPHERAL VASCULAR INTERVENTION: CATH118257

## 2020-06-14 LAB — SARS CORONAVIRUS 2 BY RT PCR (HOSPITAL ORDER, PERFORMED IN ~~LOC~~ HOSPITAL LAB): SARS Coronavirus 2: NEGATIVE

## 2020-06-14 LAB — POCT ACTIVATED CLOTTING TIME
Activated Clotting Time: 267 seconds
Activated Clotting Time: 279 seconds
Activated Clotting Time: 225 seconds
Activated Clotting Time: 190 seconds
Activated Clotting Time: 172 seconds

## 2020-06-14 LAB — GLUCOSE, CAPILLARY: Glucose-Capillary: 121 mg/dL — ABNORMAL HIGH (ref 70–99)

## 2020-06-14 SURGERY — ABDOMINAL AORTOGRAM W/LOWER EXTREMITY
Anesthesia: LOCAL

## 2020-06-14 MED ORDER — ACETAMINOPHEN 325 MG PO TABS: 650.0000 mg | ORAL_TABLET | ORAL | Status: DC | PRN

## 2020-06-14 MED ORDER — ASPIRIN 81 MG PO CHEW: 81.0000 mg | CHEWABLE_TABLET | ORAL | Status: DC

## 2020-06-14 MED ORDER — HEPARIN (PORCINE) IN NACL 1000-0.9 UT/500ML-% IV SOLN
INTRAVENOUS | Status: DC | PRN
  Administered 2020-06-14: 500 mL

## 2020-06-14 MED ORDER — MIDAZOLAM HCL 2 MG/2ML IJ SOLN
INTRAMUSCULAR | Status: DC | PRN
  Administered 2020-06-14: 1 mg via INTRAVENOUS

## 2020-06-14 MED ORDER — ASPIRIN EC 81 MG PO TBEC
81.0000 mg | DELAYED_RELEASE_TABLET | Freq: Every day | ORAL | Status: DC
  Administered 2020-06-15: 81 mg via ORAL
  Filled 2020-06-14: qty 1

## 2020-06-14 MED ORDER — HEPARIN (PORCINE) IN NACL 1000-0.9 UT/500ML-% IV SOLN
INTRAVENOUS | Status: AC
  Filled 2020-06-14: qty 1000

## 2020-06-14 MED ORDER — CLOPIDOGREL BISULFATE 300 MG PO TABS
ORAL_TABLET | ORAL | Status: DC | PRN
  Administered 2020-06-14: 300 mg via ORAL

## 2020-06-14 MED ORDER — SODIUM CHLORIDE 0.9% FLUSH: 3.0000 mL | INTRAVENOUS | Status: DC | PRN

## 2020-06-14 MED ORDER — SODIUM CHLORIDE 0.9 % WEIGHT BASED INFUSION
3.0000 mL/kg/h | INTRAVENOUS | Status: DC
  Administered 2020-06-14: 3 mL/kg/h via INTRAVENOUS

## 2020-06-14 MED ORDER — ROSUVASTATIN CALCIUM 5 MG PO TABS
10.0000 mg | ORAL_TABLET | Freq: Every morning | ORAL | Status: DC
  Administered 2020-06-14 – 2020-06-15 (×2): 10 mg via ORAL
  Filled 2020-06-14 (×2): qty 2

## 2020-06-14 MED ORDER — LISINOPRIL 5 MG PO TABS
5.0000 mg | ORAL_TABLET | Freq: Every day | ORAL | Status: DC
  Administered 2020-06-14 – 2020-06-15 (×2): 5 mg via ORAL
  Filled 2020-06-14 (×2): qty 1

## 2020-06-14 MED ORDER — ONDANSETRON HCL 4 MG/2ML IJ SOLN: 4.0000 mg | Freq: Four times a day (QID) | INTRAMUSCULAR | Status: DC | PRN

## 2020-06-14 MED ORDER — LIDOCAINE HCL (PF) 1 % IJ SOLN
INTRAMUSCULAR | Status: AC
  Filled 2020-06-14: qty 30

## 2020-06-14 MED ORDER — MORPHINE SULFATE (PF) 4 MG/ML IV SOLN
2.0000 mg | Freq: Once | INTRAVENOUS | Status: AC
  Administered 2020-06-14: 2 mg via INTRAVENOUS

## 2020-06-14 MED ORDER — SODIUM CHLORIDE 0.9% FLUSH
3.0000 mL | INTRAVENOUS | Status: DC | PRN
Start: 2020-06-14 — End: 2020-06-15

## 2020-06-14 MED ORDER — GLIMEPIRIDE 2 MG PO TABS
2.0000 mg | ORAL_TABLET | Freq: Every day | ORAL | Status: DC
  Administered 2020-06-15: 2 mg via ORAL
  Filled 2020-06-14: qty 1

## 2020-06-14 MED ORDER — MORPHINE SULFATE (PF) 2 MG/ML IV SOLN
INTRAVENOUS | Status: AC
  Filled 2020-06-14: qty 1

## 2020-06-14 MED ORDER — SODIUM CHLORIDE 0.9 % IV SOLN: INTRAVENOUS | Status: AC

## 2020-06-14 MED ORDER — HEPARIN SODIUM (PORCINE) 1000 UNIT/ML IJ SOLN
INTRAMUSCULAR | Status: AC
  Filled 2020-06-14: qty 1

## 2020-06-14 MED ORDER — HEPARIN SODIUM (PORCINE) 1000 UNIT/ML IJ SOLN
INTRAMUSCULAR | Status: DC | PRN
  Administered 2020-06-14: 8000 [IU] via INTRAVENOUS
  Administered 2020-06-14: 2500 [IU] via INTRAVENOUS

## 2020-06-14 MED ORDER — LIDOCAINE HCL (PF) 1 % IJ SOLN
INTRAMUSCULAR | Status: DC | PRN
  Administered 2020-06-14 (×2): 30 mL

## 2020-06-14 MED ORDER — CLOPIDOGREL BISULFATE 75 MG PO TABS
75.0000 mg | ORAL_TABLET | Freq: Every day | ORAL | Status: DC
  Administered 2020-06-15: 75 mg via ORAL
  Filled 2020-06-14: qty 1

## 2020-06-14 MED ORDER — HYDRALAZINE HCL 20 MG/ML IJ SOLN
5.0000 mg | INTRAMUSCULAR | Status: DC | PRN
  Administered 2020-06-14: 5 mg via INTRAVENOUS

## 2020-06-14 MED ORDER — FENTANYL CITRATE (PF) 100 MCG/2ML IJ SOLN
INTRAMUSCULAR | Status: AC
  Filled 2020-06-14: qty 2

## 2020-06-14 MED ORDER — MIDAZOLAM HCL 2 MG/2ML IJ SOLN
INTRAMUSCULAR | Status: AC
  Filled 2020-06-14: qty 2

## 2020-06-14 MED ORDER — CLOPIDOGREL BISULFATE 300 MG PO TABS
ORAL_TABLET | ORAL | Status: AC
  Filled 2020-06-14: qty 1

## 2020-06-14 MED ORDER — MORPHINE SULFATE (PF) 2 MG/ML IV SOLN
2.0000 mg | Freq: Once | INTRAVENOUS | Status: AC
  Administered 2020-06-14: 2 mg via INTRAVENOUS
  Filled 2020-06-14: qty 1

## 2020-06-14 MED ORDER — HYDRALAZINE HCL 20 MG/ML IJ SOLN
INTRAMUSCULAR | Status: AC
  Filled 2020-06-14: qty 1

## 2020-06-14 MED ORDER — SODIUM CHLORIDE 0.9% FLUSH
3.0000 mL | Freq: Two times a day (BID) | INTRAVENOUS | Status: DC
  Administered 2020-06-14 (×2): 3 mL via INTRAVENOUS

## 2020-06-14 MED ORDER — FENTANYL CITRATE (PF) 100 MCG/2ML IJ SOLN
INTRAMUSCULAR | Status: DC | PRN
  Administered 2020-06-14: 25 ug via INTRAVENOUS

## 2020-06-14 MED ORDER — LABETALOL HCL 5 MG/ML IV SOLN
10.0000 mg | INTRAVENOUS | Status: DC | PRN
  Administered 2020-06-14: 10 mg via INTRAVENOUS

## 2020-06-14 MED ORDER — SODIUM CHLORIDE 0.9 % IV SOLN: 250.0000 mL | INTRAVENOUS | Status: DC | PRN

## 2020-06-14 MED ORDER — FENTANYL CITRATE (PF) 100 MCG/2ML IJ SOLN
INTRAMUSCULAR | Status: DC | PRN
  Administered 2020-06-14: 50 ug via INTRAVENOUS

## 2020-06-14 MED ORDER — IODIXANOL 320 MG/ML IV SOLN
INTRAVENOUS | Status: DC | PRN
  Administered 2020-06-14: 99 mL via INTRA_ARTERIAL

## 2020-06-14 MED ORDER — LABETALOL HCL 5 MG/ML IV SOLN
INTRAVENOUS | Status: AC
  Filled 2020-06-14: qty 4

## 2020-06-14 MED ORDER — SODIUM CHLORIDE 0.9 % WEIGHT BASED INFUSION: 1.0000 mL/kg/h | INTRAVENOUS | Status: DC

## 2020-06-14 SURGICAL SUPPLY — 23 items
BALLN MUSTANG 5.0X20 75 (BALLOONS) ×6
BALLOON MUSTANG 5.0X20 75 (BALLOONS) ×4 IMPLANT
CATH ANGIO 5F BER2 65CM (CATHETERS) ×3 IMPLANT
CATH ANGIO 5F PIGTAIL 65CM (CATHETERS) ×3 IMPLANT
CATH STRAIGHT 5FR 65CM (CATHETERS) ×3 IMPLANT
GUIDEWIRE ANGLED .035X150CM (WIRE) ×3 IMPLANT
KIT ENCORE 26 ADVANTAGE (KITS) ×6 IMPLANT
KIT PV (KITS) ×3 IMPLANT
SHEATH BRITE TIP 7FR 35CM (SHEATH) ×6 IMPLANT
SHEATH PINNACLE 5F 10CM (SHEATH) ×3 IMPLANT
SHEATH PINNACLE 7F 10CM (SHEATH) ×3 IMPLANT
SHEATH PINNACLE 8F 10CM (SHEATH) ×3 IMPLANT
SHEATH PROBE COVER 6X72 (BAG) ×3 IMPLANT
STENT VIABAHN 8X29X80 VBX (Permanent Stent) ×6 IMPLANT
STOPCOCK MORSE 400PSI 3WAY (MISCELLANEOUS) ×3 IMPLANT
SYR MEDRAD MARK 7 150ML (SYRINGE) ×3 IMPLANT
TAPE VIPERTRACK RADIOPAQ (MISCELLANEOUS) ×4 IMPLANT
TAPE VIPERTRACK RADIOPAQUE (MISCELLANEOUS) ×6
TRANSDUCER W/STOPCOCK (MISCELLANEOUS) ×3 IMPLANT
TRAY PV CATH (CUSTOM PROCEDURE TRAY) ×3 IMPLANT
TUBING CIL FLEX 10 FLL-RA (TUBING) ×3 IMPLANT
WIRE AMPLATZ SS-J .035X180CM (WIRE) ×3 IMPLANT
WIRE HITORQ VERSACORE ST 145CM (WIRE) ×6 IMPLANT

## 2020-06-14 NOTE — Progress Notes (Signed)
Bilateral sheaths present in femoral arteries. 32fr in right groin, 72fr in left groin. Bilateral hematomas present prior to sheath removal level 1 in right groin , with active bleeding/oozing present, level 2 in left groin.  3fr sheath aspirated and removed first , due to oozing. Manual pressure applied for 25 minutes. Hematoma expressed. Site level 1, puffy, no hard hematoma palpable. Tegderm dressing applied.   56fr sheath aspirated and removed from left femoral, manual pressure applied for 25 minutes, hematoma on left groin also expressed, site is level 1 now, puffy, with no hard hematoma present. Tegaderm dressing applied. Patient has bilateral groin tenderness, with palpation.    Bilateral dp and pt pulses present with doppler.   Bedrest instructions given.   Bedrest begins at 15:00:00

## 2020-06-14 NOTE — Progress Notes (Signed)
Prior to transport, left groin developed a hematoma, manual pressure applied for an additional 20 minutes. Site level 1, puffy, no hard hematoma present.   Bedrest restarts at 15:44:00

## 2020-06-14 NOTE — Plan of Care (Signed)

## 2020-06-14 NOTE — Progress Notes (Signed)
Pt complaining of bilateral groin pain, Coniglio, MD made aware. Will continue to monitor.   Elaina Hoops, RN

## 2020-06-14 NOTE — Interval H&P Note (Signed)
History and Physical Interval Note:  06/14/2020 10:01 AM  Steven Perry  has presented today for surgery, with the diagnosis of PAD.  The various methods of treatment have been discussed with the patient and family. After consideration of risks, benefits and other options for treatment, the patient has consented to  Procedure(s): ABDOMINAL AORTOGRAM W/LOWER EXTREMITY (N/A) as a surgical intervention.  The patient's history has been reviewed, patient examined, no change in status, stable for surgery.  I have reviewed the patient's chart and labs.  Questions were answered to the patient's satisfaction.     [name]

## 2020-06-15 DIAGNOSIS — E119 Type 2 diabetes mellitus without complications: Secondary | ICD-10-CM

## 2020-06-15 DIAGNOSIS — E1122 Type 2 diabetes mellitus with diabetic chronic kidney disease: Secondary | ICD-10-CM | POA: Diagnosis not present

## 2020-06-15 DIAGNOSIS — E1151 Type 2 diabetes mellitus with diabetic peripheral angiopathy without gangrene: Secondary | ICD-10-CM | POA: Diagnosis not present

## 2020-06-15 DIAGNOSIS — I10 Essential (primary) hypertension: Secondary | ICD-10-CM

## 2020-06-15 DIAGNOSIS — I708 Atherosclerosis of other arteries: Secondary | ICD-10-CM | POA: Diagnosis not present

## 2020-06-15 DIAGNOSIS — Z720 Tobacco use: Secondary | ICD-10-CM

## 2020-06-15 DIAGNOSIS — F17210 Nicotine dependence, cigarettes, uncomplicated: Secondary | ICD-10-CM | POA: Diagnosis not present

## 2020-06-15 DIAGNOSIS — I739 Peripheral vascular disease, unspecified: Secondary | ICD-10-CM

## 2020-06-15 DIAGNOSIS — E782 Mixed hyperlipidemia: Secondary | ICD-10-CM | POA: Diagnosis not present

## 2020-06-15 DIAGNOSIS — Z20822 Contact with and (suspected) exposure to covid-19: Secondary | ICD-10-CM | POA: Diagnosis not present

## 2020-06-15 LAB — BASIC METABOLIC PANEL
Anion gap: 7 (ref 5–15)
BUN: 25 mg/dL — ABNORMAL HIGH (ref 8–23)
CO2: 23 mmol/L (ref 22–32)
Calcium: 8.5 mg/dL — ABNORMAL LOW (ref 8.9–10.3)
Chloride: 105 mmol/L (ref 98–111)
Creatinine, Ser: 1.7 mg/dL — ABNORMAL HIGH (ref 0.61–1.24)
GFR, Estimated: 43 mL/min — ABNORMAL LOW (ref >60)
Glucose, Bld: 128 mg/dL — ABNORMAL HIGH (ref 70–99)
Potassium: 4.3 mmol/L (ref 3.5–5.1)
Sodium: 135 mmol/L (ref 135–145)

## 2020-06-15 LAB — CBC
HCT: 37.4 % — ABNORMAL LOW (ref 39.0–52.0)
Hemoglobin: 12.4 g/dL — ABNORMAL LOW (ref 13.0–17.0)
MCH: 31.8 pg (ref 26.0–34.0)
MCHC: 33.2 g/dL (ref 30.0–36.0)
MCV: 95.9 fL (ref 80.0–100.0)
Platelets: 251 10*3/uL (ref 150–400)
RBC: 3.9 MIL/uL — ABNORMAL LOW (ref 4.22–5.81)
RDW: 13.9 % (ref 11.5–15.5)
WBC: 11.2 10*3/uL — ABNORMAL HIGH (ref 4.0–10.5)
nRBC: 0 % (ref 0.0–0.2)

## 2020-06-15 MED ORDER — ROSUVASTATIN CALCIUM 20 MG PO TABS: 20.0000 mg | ORAL_TABLET | Freq: Every morning | ORAL | Status: DC

## 2020-06-15 MED ORDER — ROSUVASTATIN CALCIUM 10 MG PO TABS: 20.0000 mg | ORAL_TABLET | Freq: Every day | ORAL | 2 refills | Status: DC

## 2020-06-15 MED ORDER — ASPIRIN 81 MG PO TBEC
81.0000 mg | DELAYED_RELEASE_TABLET | Freq: Every day | ORAL | 2 refills | Status: AC
Start: 2020-06-16 — End: ?

## 2020-06-15 MED ORDER — CLOPIDOGREL BISULFATE 75 MG PO TABS: 75.0000 mg | ORAL_TABLET | Freq: Every day | ORAL | 1 refills | Status: DC

## 2020-06-15 NOTE — Discharge Summary (Addendum)
The patient has been seen in conjunction with Farrel Demark, NP. All aspects of care have been considered and discussed. The patient has been personally interviewed, examined, and all clinical data has been reviewed.   Femoral cath sites are unremarkable.  No hematoma noted.  Creatinine 1.7 which is stable compared to 06/08/2020.  Discussed smoking cessation and aggressive risk factor modification.  Statin intensity increased.  Follow-up as outlined below.   Discharge Summary    Patient ID: Steven Perry MRN: [id number]; DOB: 1950-03-18  Admit date: 06/14/2020 Discharge date: 06/15/2020  PCP:  Mateo Flow, MD   [facility] Providers Cardiologist: follows Dr Gwenlyn Found for PAD    Discharge Diagnoses    Principal Problem:   Peripheral arterial disease ([place]) Active Problems:   Tobacco abuse   Essential hypertension   Type 2 diabetes mellitus without complication, without long-term current use of insulin (HCC)   Hyperlipidemia   Claudication in peripheral vascular disease [facility])    Diagnostic Studies/Procedures    Abdominal aortogram and bilateral iliac angiogram 06/14/20 by Dr Gwenlyn Found:  1: Abdominal aorta-moderately atherosclerotic 2: Left lower extremity-95% ostial left common iliac artery stenosis 3: Right lower extremity-80% ostial right common iliac artery stenosis  IMPRESSION:Steven Perry has high-grade ostial bilateral iliac artery stenoses left greater than right which corresponds to his Doppler study and his severity of symptoms. We will proceed with PTA and covered stenting of bilateral iliac ostia using "kissing stent technique".  Final Impression:Successful bilateral iliac artery PTA and covered stenting using "kissing stent technique" an 8 mm x 29 mm VBX stents. The patient received 300 mg of p.o. Plavix. The sheath will be removed once ACT falls below 170 pressure held. Patient will be hydrated overnight, discharged home in the morning. We will get lower  extremity arterial Doppler studies in our [facility] line office next week and I will see him back the week after in follow-up.   BLE doppler on 05/28/20:  Right: Resting right ankle-brachial index is within normal range. No  evidence of significant right lower extremity arterial disease. The right  toe-brachial index is abnormal.   Left: Resting left ankle-brachial index indicates moderate left lower  extremity arterial disease. The left toe-brachial index is abnormal.   _____________   History of Present Illness     Steven Perry is a 70 y.o. male with PMH of HTN, type 2 DM, HLD, active tobacco abuse, COPD, PAD with claudication who presented here on 06/14/20 for elective peripheral angiography and intervention by Dr. Gwenlyn Found.   Patient had ongoing L>R lifestyle limiting claudication for 2 years. He was referred to Dr Gwenlyn Found and was last seen in the office on 06/08/20. He had an arterial doppler completed on 05/28/20 showed right ABI 0.99 and left ABI 0.69, did have a high frequency signal in the left common iliac artery. The decision was made to go for peripheral angiography with intervention.    Hospital Course     Consultants: N/A  PAD with claudication - ongoing L>R lifestyle limiting claudication for 2 years  - Arterial doppler on 05/28/20 with Right ABI 0.99 and Left ABI 0.69 - s/p abdominal aortogram and bilateral iliac angiogram 06/14/20 by Dr Gwenlyn Found: LLE 95% ostial left common iliac artery stenosis and RLE 80% ostial right common iliac artery stenosis, treated with bilateral iliac artery PTA and stent  - will discharge on DAPT with ASA and Plavix and Crestor 47m daily  - patient has repeat Doppler study scheduled on 06/22/20 and follow  up appointment with Dr Gwenlyn Found on 07/04/20, advised the patient to follow up as scheduled.   HTN - BP is improving this morning - continue lisinopril 74m daily   HLD - LDL 107 on 06/08/20, 10 yrs ASCVD 47.5% - will increase Crestor from 10 to 29mdaily  for high intensity therapy , discussed potential adverse effect to watch for  - repeat lipid panel in 4 weeks, LFTs in 2 weeks   Type 2 DM - patient reports his recent A1C was 7.1% , fair control  - continue home med [facility] glimepiride and Actos [facility]  - follow up with PCP   CKD stage IIIb - Cr baseline around 1.9 with eGFR 40 since 2019 per nephrology notes - renal index is near baseline currently, gentle IVF given for recent cath/contrast use  - continue ACEi, needs routine BMP for renal function monitor, [facility]  - discussed the need for avoiding NSAIDs use   Tobacco abuse  - strongly recommended smoking cessation   COPD - no acute exacerbation at this time , resume home bronchodilator    Did the patient have an acute coronary syndrome (MI, NSTEMI, STEMI, etc) this admission?:  No                               Did the patient have a percutaneous coronary intervention (stent / angioplasty)?:  No.       _____________  Discharge Vitals Blood pressure 121/67, pulse 72, temperature 98.4 F (36.9 C), temperature source Oral, resp. rate 18, height 5' 8"  (1.727 m), weight 71.7 kg, SpO2 98 %.  Filed Weights   06/14/20 0538 06/14/20 1601  Weight: 71.7 kg 71.7 kg   Vitals:  Vitals:   06/15/20 0421 06/15/20 0800  BP: 129/74 121/67  Pulse: 72   Resp: 18   Temp: 98.4 F (36.9 C)   SpO2: 98%    Physical exam:   General Appearance: In no apparent distress, laying in bed HEENT: Normocephalic, atraumatic. EOMs intact.  Neck: Supple, trachea midline Cardiovascular: Regular rate and rhythm, normal S1-S2,  no murmur/rub/gallop Respiratory: Resting breathing unlabored, lungs sounds clear to auscultation bilaterally, no use of accessory muscles. On room air.  No wheezes, rales or rhonchi.   Gastrointestinal: Bowel sounds positive, abdomen soft, non-tender, non-distended.  Extremities: Able to move all extremities without difficulty, no  edema/cyanosis/clubbing, bilateral groin cath site with dressing in place, no hematoma /bruise /bleeding noted, left site with mild tenderness. Femoral/popliteal/dorsalispedis pulse 2+ bilaterally. Bilateral feet with no gross sensory loss, able to wiggle toes bilaterally on demand.  Genitourinary: no penile discharge observed  Musculoskeletal: Normal muscle bulk and tone, no limited range of motion, no swollen or erythematous joints Skin: Intact, warm, dry. No rashes or petechiae noted in exposed areas.  Neurologic: Alert, oriented to person, place and time. Fluent speech, no cognitive deficit, no gross focal neuro deficit Psychiatric: Normal affect. Mood is appropriate.  Telemetry with NSR 70-80s, no acute events noted   Labs & Radiologic Studies    CBC Recent Labs    06/15/20 0226  WBC 11.2*  HGB 12.4*  HCT 37.4*  MCV 95.9  PLT 25433 Basic Metabolic Panel Recent Labs    06/15/20 0226  NA 135  K 4.3  CL 105  CO2 23  GLUCOSE 128*  BUN 25*  CREATININE 1.70*  CALCIUM 8.5*   Liver Function Tests No  results for input(s): AST, ALT, ALKPHOS, BILITOT, PROT, ALBUMIN in the last 72 hours. No results for input(s): LIPASE, AMYLASE in the last 72 hours. High Sensitivity Troponin:   No results for input(s): TROPONINIHS in the last 720 hours.  BNP Invalid input(s): POCBNP D-Dimer No results for input(s): DDIMER in the last 72 hours. Hemoglobin A1C No results for input(s): HGBA1C in the last 72 hours. Fasting Lipid Panel No results for input(s): CHOL, HDL, LDLCALC, TRIG, CHOLHDL, LDLDIRECT in the last 72 hours. Thyroid Function Tests No results for input(s): TSH, T4TOTAL, T3FREE, THYROIDAB in the last 72 hours.  Invalid input(s): FREET3 _____________  PERIPHERAL VASCULAR CATHETERIZATION  Result Date: 06/14/2020  [id number] LOCATION:  FACILITY: [facility] PHYSICIAN: [name], M.D. Nov 20, 1950 DATE OF PROCEDURE:  06/14/2020 DATE OF DISCHARGE: PV Angiogram/Intervention History obtained  from chart review.Steven Perry is a 69 y.o.  thin appearing married Caucasian male father of 2 children, grandfather of 2 grandchildren referred by Dr.Jaber Weston Settle for evaluation treatment of lifestyle limiting claudication. Patient is accompanied by his wife [name] today.   I last saw him in the office 05/09/2020.  He is a retired Dealer. He does have a history of tobacco abuse having smoked 25 pack years currently smoking 1/2 pack/day. History of hypertension, diabetes and hyperlipidemia. His mother had stents in her 65s and his brother has had stents as well. He is never had a heart attack or stroke. He denies chest pain but does get shortness of breath because of COPD. He has a serum creatinine that runs in the 1.8 range. He has had left greater than right lower extremity lifestyle limiting claudication for the last 2 years which is lifestyle limiting.  I performed lower extremity arterial Doppler studies on him 05/28/2020 revealing a right ABI of 0.99 and a left of 0.69.  He did have a high-frequency signal in his left common iliac artery.  He wishes to proceed with peripheral angiography and intervention for lifestyle limiting claudication. Pre Procedure Diagnosis: Peripheral arterial disease Post Procedure Diagnosis: Peripheral arterial disease Operators: Dr. [name] Procedures Performed:  1.  Ultrasound-guided right and left common femoral access  2.  Abdominal aortogram/bilateral iliac angiogram  3.  PTA and covered stenting bilateral iliac artery ostia using "kissing stent technique"  PROCEDURE DESCRIPTION: The patient was brought to the second floor [facility] cath lab in the the postabsorptive state. He was premedicated with IV Versed and fentanyl. His right and left groins were prepped and shaved in usual sterile fashion. Xylocaine 1% was used for local anesthesia. A 5 French sheath was inserted into the right common femoral artery using standard Seldinger  technique.  Ultrasound was used to guide access of the right and left common femoral artery.  A digital image of each was captured and placed in the patient's chart.  A 5 French pigtail catheters placed the distal abdominal aorta.  Distal abdominal aortography and bilateral iliac angiography were performed.  On the pigtail she is for the entirety of the of the case (99 cc of contrast total administered to the patient).  Retrograde aortic pressures monitored during the case.  Angiographic Data: 1: Abdominal aorta- moderately atherosclerotic 2: Left lower extremity- 95% ostial left common iliac artery stenosis 3: Right lower extremity- 80% ostial right common iliac artery stenosis   Steven Perry has high-grade ostial bilateral iliac artery stenoses left greater than right which corresponds to his Doppler study and his severity of symptoms.  We will proceed with PTA and covered  stenting of bilateral iliac ostia using "kissing stent technique". Procedure Description: 7 French Brite tip sheath were inserted into both common femoral arteries using ultrasound guidance.  Patient received a total of 10,500 units of heparin with an ending ACT of 267.  A total of 99 cc of contrast was administered to the patient.  Using 2035 versa core wires and two 5 mm x 2 cm Mavik balloons I predilated the iliac ostia simultaneously.  Following this I carefully positioned two 8 mm x 29 mm long VBX covered stents across each iliac ostia extending several millimeters into the distal aorta and deployed each at 12 atm resulting reduction of 80 to 95% bilateral iliac artery ostial stenosis to 0% residual.  The patient tolerated procedure well.  A completion aortogram was performed using a pigtail catheter.  The 7 [place] Brite tip sheath was then exchanged over a wire on the right-hand side for a short 7 [place] sheath.  I attempted to do this on the left but the 7 French sheath "buckled" in the groin requiring exchanging out the versa core wire  for a Amplatz and placing a short 7 [place] sheath.  Unfortunately there was some oozing around the sheath upgraded to an 8 [place] sheath which resulted in hemostasis.  The hematomas were manually reduced. Final Impression: Successful bilateral iliac artery PTA and covered stenting using "kissing stent technique" an 8 mm x 29 mm VBX stents.  The patient received 300 mg of p.o. Plavix.  The sheath will be removed once ACT falls below 170 pressure held.  Patient will be hydrated overnight, discharged home in the morning.  We will get lower extremity arterial Doppler studies in our [facility] line office next week and I will see him back the week after in follow-up. [name]. MD, [facility] 06/14/2020 11:26 AM   VAS [place] LE ART SEG MULTI (Segm&LE Reynauds)  Result Date: 05/28/2020 LOWER EXTREMITY DOPPLER STUDY Indications: Claudication, peripheral artery disease, and Patient has noticed              over the last 2-3 years he has bilateral hip and leg pain with              exertion left > right, after about 10-15 minutes. He has noticed              this has gotten worse since last April once he retired. He denies              rest pain. High Risk Factors: Hypertension, hyperlipidemia, Diabetes, current smoker. Other Factors: SEE AORTOILIAC AND BILATERAL LEG ARTERIAL DUPLEX REPORTS.  Comparison Study: None Performing Technologist: Salvadore Dom RVT  Examination Guidelines: A complete evaluation includes at minimum, Doppler waveform signals and systolic blood pressure reading at the level of bilateral brachial, anterior tibial, and posterior tibial arteries, when vessel segments are accessible. Bilateral testing is considered an integral part of a complete examination. Photoelectric Plethysmograph (PPG) waveforms and toe systolic pressure readings are included as required and additional duplex testing as needed. Limited examinations for reoccurring indications may be performed as noted.  ABI Findings:  +---------+------------------+-----+----------------+--------+ Right    Rt Pressure (mmHg)IndexWaveform        Comment  +---------+------------------+-----+----------------+--------+ Brachial 146                                             +---------+------------------+-----+----------------+--------+ CFA  barely triphasic         +---------+------------------+-----+----------------+--------+ Popliteal                       triphasic                +---------+------------------+-----+----------------+--------+ ATA      130               0.89 triphasic                +---------+------------------+-----+----------------+--------+ PTA      145               0.99 triphasic                +---------+------------------+-----+----------------+--------+ PERO     124               0.85 triphasic                +---------+------------------+-----+----------------+--------+ Great Toe95                0.65 Abnormal                 +---------+------------------+-----+----------------+--------+ +---------+------------------+-----+--------+-------+ Left     Lt Pressure (mmHg)IndexWaveformComment +---------+------------------+-----+--------+-------+ Brachial 139                                    +---------+------------------+-----+--------+-------+ CFA                             biphasic        +---------+------------------+-----+--------+-------+ Popliteal                       biphasic        +---------+------------------+-----+--------+-------+ ATA      81                0.55 biphasic        +---------+------------------+-----+--------+-------+ PTA      101               0.69 biphasic        +---------+------------------+-----+--------+-------+ PERO     79                0.54 biphasic        +---------+------------------+-----+--------+-------+ Great Toe48                0.33 Abnormal         +---------+------------------+-----+--------+-------+ +-------+-----------+-----------+------------+------------+ ABI/TBIToday's ABIToday's TBIPrevious ABIPrevious TBI +-------+-----------+-----------+------------+------------+ Right  .99        .65                                 +-------+-----------+-----------+------------+------------+ Left   .69        .33                                 +-------+-----------+-----------+------------+------------+   Summary: Right: Resting right ankle-brachial index is within normal range. No evidence of significant right lower extremity arterial disease. The right toe-brachial index is abnormal. Left: Resting left ankle-brachial index indicates moderate left lower extremity arterial disease. The left toe-brachial index is abnormal.  *See table(s) above for measurements and observations.  Electronically signed by [name] MD on  05/28/2020 at 1:36:48 PM.    Final    VAS [place] LOWER EXTREMITY ARTERIAL DUPLEX  Result Date: 05/28/2020 LOWER EXTREMITY ARTERIAL DUPLEX STUDY Indications: Claudication, peripheral artery disease, and Patient has noticed              over the last 2-3 years he has bilateral hip and leg pain with              exertion left > right, after about 10-15 minutes. He has noticed              this has gotten worse since last April once he retired. He denies              rest pain. High Risk Factors: Hypertension, hyperlipidemia, Diabetes, current smoker. Other Factors: SEE ABI AND AORTOILIAC DUPLEX REPORTS.  Current ABI: Right .99 Left .67 Comparison Study: None Performing Technologist: Alecia Mackin RVT, RDCS (AE), RDMS  Examination Guidelines: A complete evaluation includes B-mode imaging, spectral Doppler, color Doppler, and power Doppler as needed of all accessible portions of each vessel. Bilateral testing is considered an integral part of a complete examination. Limited examinations for reoccurring indications may be performed as  noted.  +-----------+--------+-----+--------+---------+--------------------------------+ RIGHT      PSV cm/sRatioStenosisWaveform Comments                         +-----------+--------+-----+--------+---------+--------------------------------+ CFA Prox   120                  biphasic Delayed acceleration in systolic                                          upstroke                         +-----------+--------+-----+--------+---------+--------------------------------+ CFA Distal 55                   biphasic                                  +-----------+--------+-----+--------+---------+--------------------------------+ DFA        83                   triphasic                                 +-----------+--------+-----+--------+---------+--------------------------------+ SFA Prox   66                   biphasic                                  +-----------+--------+-----+--------+---------+--------------------------------+ SFA Mid    67                   biphasic                                  +-----------+--------+-----+--------+---------+--------------------------------+ SFA Distal 62                   biphasic                                  +-----------+--------+-----+--------+---------+--------------------------------+  POP Prox   53                   biphasic                                  +-----------+--------+-----+--------+---------+--------------------------------+ POP Distal 41                   biphasic                                  +-----------+--------+-----+--------+---------+--------------------------------+ TP Trunk   51                   biphasic                                  +-----------+--------+-----+--------+---------+--------------------------------+ ATA Prox   58                   biphasic                                   +-----------+--------+-----+--------+---------+--------------------------------+ ATA Distal 45                   biphasic                                  +-----------+--------+-----+--------+---------+--------------------------------+ PTA Prox   40                   biphasic                                  +-----------+--------+-----+--------+---------+--------------------------------+ PTA Distal 44                   biphasic                                  +-----------+--------+-----+--------+---------+--------------------------------+ PERO Prox  31                   biphasic                                  +-----------+--------+-----+--------+---------+--------------------------------+ PERO Distal30                   biphasic                                  +-----------+--------+-----+--------+---------+--------------------------------+  +-----------+--------+-----+--------+-------------------+----------------------+ LEFT       PSV cm/sRatioStenosisWaveform           Comments               +-----------+--------+-----+--------+-------------------+----------------------+ CFA Prox   96                   monophasic         Delayed acceleration  systolic upstroke      +-----------+--------+-----+--------+-------------------+----------------------+ CFA Distal 43                   biphasic                                  +-----------+--------+-----+--------+-------------------+----------------------+ DFA        32                   monophasic                                +-----------+--------+-----+--------+-------------------+----------------------+ SFA Prox   37                   biphasic                                  +-----------+--------+-----+--------+-------------------+----------------------+ SFA Mid    44                   biphasic                                   +-----------+--------+-----+--------+-------------------+----------------------+ SFA Distal 43                   monophasic                                +-----------+--------+-----+--------+-------------------+----------------------+ POP Prox   23                   monophasic                                +-----------+--------+-----+--------+-------------------+----------------------+ POP Distal 23                   monophasic                                +-----------+--------+-----+--------+-------------------+----------------------+ TP Trunk   29                   monophasic                                +-----------+--------+-----+--------+-------------------+----------------------+ ATA Prox   15                   dampened monophasic                       +-----------+--------+-----+--------+-------------------+----------------------+ ATA Mid    8                    monophasic                                +-----------+--------+-----+--------+-------------------+----------------------+ ATA Distal 10                   monophasic                                +-----------+--------+-----+--------+-------------------+----------------------+  PTA Prox   24                   monophasic                                +-----------+--------+-----+--------+-------------------+----------------------+ PTA Distal 21                   monophasic                                +-----------+--------+-----+--------+-------------------+----------------------+ PERO Prox  13                   monophasic                                +-----------+--------+-----+--------+-------------------+----------------------+ PERO Distal8                    monophasic                                +-----------+--------+-----+--------+-------------------+----------------------+  Summary: Right: Mild atherosclerosis noted throughout extremity. No evidence of  stenosis. Left: Irregular plaque in distal CFA with mild atherosclerosis noted throughout extremity. No evidence of stenosis.  See table(s) above for measurements and observations. Electronically signed by [name] MD on 05/28/2020 at 1:35:41 PM.    Final    VAS US AORTA/IVC/ILIACS  Result Date: 05/28/2020 ABDOMINAL AORTA STUDY Indications: Patient has noticed over the last 2-3 years he has bilateral hip              and leg pain with exertion left > right, after about 10-15 minutes.              He has noticed this has gotten worse since last April once he              retired. He denies rest pain Risk Factors: Hypertension, hyperlipidemia, Diabetes, current smoker. Other Factors: SEE ABI AND BILATERAL LEG ARTERIAL DUPLEX REPORTS. Vascular Interventions: None. Limitations: Air/bowel gas and patient discomfort.  Comparison Study: None Performing Technologist: Alecia Mackin RVT, RDCS (AE), RDMS  Examination Guidelines: A complete evaluation includes B-mode imaging, spectral Doppler, color Doppler, and power Doppler as needed of all accessible portions of each vessel. Bilateral testing is considered an integral part of a complete examination. Limited examinations for reoccurring indications may be performed as noted.  Abdominal Aorta Findings: +-------------+-------+----------+----------+----------+--------+--------------+ Location     AP (cm)Trans (cm)PSV (cm/s)Waveform  ThrombusComments       +-------------+-------+----------+----------+----------+--------+--------------+ Supraceliac                                               overlying                                                                bowel gas      +-------------+-------+----------+----------+----------+--------+--------------+ Proximal     1.80   2.10  85        biphasic          overlying                                                                bowel gas       +-------------+-------+----------+----------+----------+--------+--------------+ Distal                        59        biphasic                         +-------------+-------+----------+----------+----------+--------+--------------+ RT CIA Prox                   338       biphasic                         +-------------+-------+----------+----------+----------+--------+--------------+ RT CIA Mid                    278       biphasic                         +-------------+-------+----------+----------+----------+--------+--------------+ RT CIA Distal                 205       biphasic                         +-------------+-------+----------+----------+----------+--------+--------------+ RT EIA Prox                   129       monophasic                       +-------------+-------+----------+----------+----------+--------+--------------+ RT EIA Mid                    132       biphasic                         +-------------+-------+----------+----------+----------+--------+--------------+ RT EIA Distal                 109       biphasic                         +-------------+-------+----------+----------+----------+--------+--------------+ LT CIA Prox                   225       biphasic                         +-------------+-------+----------+----------+----------+--------+--------------+ LT CIA Mid                    638       monophasic                       +-------------+-------+----------+----------+----------+--------+--------------+ LT CIA Distal                 106  monophasic                       +-------------+-------+----------+----------+----------+--------+--------------+ LT EIA Prox                   78        monophasicPresent intramural                                                               thrombus noted                                                           within; ? slow                                                            flow           +-------------+-------+----------+----------+----------+--------+--------------+ LT EIA Mid                    64        monophasic                       +-------------+-------+----------+----------+----------+--------+--------------+ LT EIA Distal                 83        monophasic                       +-------------+-------+----------+----------+----------+--------+--------------+ Visualization of the Super[place]iac artery and Proximal Abdominal Aorta was limited. IVC/Iliac Findings: +--------+------+--------+--------+   IVC   PatentThrombusComments +--------+------+--------+--------+ IVC Proxpatent                 +--------+------+--------+--------+    Summary: Abdominal Aorta: No evidence of an abdominal aortic aneurysm was visualized. The largest aortic measurement is 2.2 cm. Moderate atherosclerosis noted throughout aorta. Stenosis: +------------------+-------------+ Location          Stenosis      +------------------+-------------+ Right Common Iliac>50% stenosis +------------------+-------------+ Left Common Iliac >50% stenosis +------------------+-------------+ Atherosclerosis noted throughout bilateral iliac arteries. IVC/Iliac: There is no evidence of thrombus involving the IVC.  *See table(s) above for measurements and observations. Patient seeing Dr. Gwenlyn Found on 06/08/2020 at 8:30 am. Vascular consult recommended.  Electronically signed by [name] MD on 05/28/2020 at 1:36:22 PM.    Final    Disposition   Patient states he is feeling well, no longer having pain of his groin sites. He is eating breakfast. He denied any chest pain, SOB, numbness or tingling of legs and feet. He is agreeable with outpatient follow up with Dr Gwenlyn Found.  Pt is being discharged home today in good condition.  Follow-up Plans & Appointments   Follow up doppler study 5/13 and office visit 5/25 with Dr Gwenlyn Found  scheduled.   Follow-up Information    Lorretta Harp, MD Follow up on 07/04/2020.  Specialties: Cardiology, Radiology Why: Please arrive 15 min early to 3:15 PM for your follow up appoinment with Dr Pearla Dubonnet information: [place] [id number] [place] Follow up on 06/22/2020.   Specialty: Cardiology Why: Please arrive for your imaging study at [facility] as scheduled  Contact information: [address] [place] [phone]             Discharge Instructions    Diet - low sodium heart healthy   Complete by: As directed    Diet Carb Modified   Complete by: As directed    Discharge instructions   Complete by: As directed    Groin Site Care  Refer to this sheet in the next few weeks. These instructions provide you with information on caring for yourself after your procedure. Your caregiver may also give you more specific instructions. Your treatment has been planned according to current medical practices, but problems sometimes occur. Call your caregiver if you have any problems or questions after your procedure.  HOME CARE INSTRUCTIONS  You may shower 24 hours after the procedure. Remove the bandage (dressing) and gently wash the site with plain soap and water. Gently pat the site dry.   Do not apply powder or lotion to the site.   Do not sit in a bathtub, swimming pool, or whirlpool for 5 to 7 days.   No bending, squatting, or lifting anything over 10 pounds (4.5 kg) as directed by your caregiver.   Inspect the site at least twice daily.   Do not drive home if you are discharged the same day of the procedure. Have someone else drive you.   You may drive 24 hours after the procedure unless otherwise instructed by your caregiver.   What to expect:  Any bruising will usually fade within 1 to 2 weeks.   Blood that collects in  the tissue (hematoma) may be painful to the touch. It should usually decrease in size and tenderness within 1 to 2 weeks.   SEEK IMMEDIATE MEDICAL CARE IF:  You have unusual pain at the groin site or down the affected leg.   You have redness, warmth, swelling, or pain at the groin site.   You have drainage (other than a small amount of blood on the dressing).   You have chills.   You have a fever or persistent symptoms for more than 72 hours.   You have a fever and your symptoms suddenly get worse.   Your leg becomes pale, cool, tingly, or numb.   You have heavy bleeding from the site. Hold pressure on the site.   Increase activity slowly   Complete by: As directed       Discharge Medications   Allergies as of 06/15/2020      Reactions   Ibuprofen Itching   Nsaids Other (See Comments)   Renal insufficiency   Statins Rash   Tolerates current Crestor dose      Medication List    TAKE these medications   ALPRAZolam 0.5 MG tablet Commonly known as: XANAX Take 0.5 mg by mouth at bedtime.   aspirin 81 MG EC tablet Take 1 tablet (81 mg total) by mouth daily. Swallow whole. Start taking on: Jun 16, 2020   budesonide-formoterol 160-4.5 MCG/ACT inhaler Commonly known as: SYMBICORT Inhale 2 puffs into the lungs 2 (two) times daily as needed (respiratory  issues.).   clopidogrel 75 MG tablet Commonly known as: PLAVIX Take 1 tablet (75 mg total) by mouth daily.   fluticasone 50 MCG/ACT nasal spray Commonly known as: FLONASE Place 1 spray into both nostrils daily as needed for allergies or rhinitis.   glimepiride 2 MG tablet Commonly known as: AMARYL Take 2 mg by mouth in the morning.   lisinopril 5 MG tablet Commonly known as: ZESTRIL Take 5 mg by mouth in the morning.   oxymetazoline 0.05 % nasal spray Commonly known as: AFRIN Place 1 spray into both nostrils 2 (two) times daily as needed for congestion.   pioglitazone 30 MG tablet Commonly known as: ACTOS Take  30 mg by mouth in the morning.   QUEtiapine 50 MG tablet Commonly known as: SEROQUEL Take 50 mg by mouth at bedtime.   rosuvastatin 10 MG tablet Commonly known as: CRESTOR Take 2 tablets (20 mg total) by mouth daily. What changed:   how much to take  when to take this   traMADol-acetaminophen 37.5-325 MG tablet Commonly known as: ULTRACET Take 1 tablet by mouth every 6 (six) hours as needed (pain).          Outstanding Labs/Studies   Doppler scheduled on 06/22/20 Consider repeat Lipid panel in 4 weeks, and LFTs in 2 weeks   Duration of Discharge Encounter   Greater than 30 minutes including physician time.  Signed, Margie Billet, NP 06/15/2020, 9:30 AM

## 2020-06-15 NOTE — Discharge Instructions (Signed)
Peripheral Vascular Disease  Peripheral vascular disease (PVD) is a disease of the blood vessels that carry blood from the heart to the rest of the body. PVD is also called peripheral artery disease (PAD) or poor circulation. PVD affects most of the body. But it affects the legs and feet the most. PVD can lead to acute limb ischemia. This happens when there is a sudden stop of blood flow to an arm or leg. This is a medical emergency. What are the causes? The most common cause of PVD is a buildup of a fatty substance (plaque) inside your arteries. This decreases blood flow. Plaque can break off and block blood in a smaller artery. This can lead to acute limb ischemia. Other common causes of PVD include:  Blood clots inside the blood vessels.  Injuries to blood vessels.  Irritation and swelling of blood vessels.  Sudden tightening of the blood vessel (spasms). What increases the risk?  A family history of PVD.  Medical conditions, including: ? High cholesterol. ? Diabetes. ? High blood pressure. ? Heart disease. ? Past problems with blood clots. ? Past injury, such as burns or a broken bone.  Other conditions, such as: ? Buerger's disease. This is caused by swollen or irritated blood vessels in your hands and feet. ? Arthritis. ? Birth defects that affect the arteries in your legs. ? Kidney disease.  Using tobacco or nicotine products.  Not getting enough exercise.  Being very overweight (obese).  Being 50 years old or older. What are the signs or symptoms?  Cramps in your butt, legs, and feet.  Pain and weakness in your legs when you are active that goes away when you rest.  Leg pain when at rest.  Leg numbness, tingling, or weakness.  Coldness in a leg or foot, especially when compared with the other leg or foot.  Skin or hair changes. These can include: ? Hair loss. ? Shiny skin. ? Pale or bluish skin. ? Thick toenails.  Being unable to get or keep an  erection.  Tiredness (fatigue).  Weak pulse or no pulse in the feet.  Wounds and sores on the toes, feet, or legs. These take longer to heal. How is this treated? Underlying causes are treated first. Other conditions, like diabetes, high cholesterol, and blood pressure, are also treated. Treatment may include:  Lifestyle changes, such as: ? Quitting smoking. ? Getting regular exercise. ? Having a diet low in fat and cholesterol. ? Not drinking alcohol.  Taking medicines, such as: ? Blood thinners. ? Medicines to improve blood flow. ? Medicines to improve your blood cholesterol.  Procedures to: ? Open the arteries and restore blood flow. ? Insert a small mesh tube (stent) to keep a blocked vessel open. ? Create a new path for blood to flow to the body (peripheral bypass). ? Remove dead tissue from a wound. ? Remove an affected leg or arm. Follow these instructions at home: Medicines  Take over-the-counter and prescription medicines only as told by your doctor.  If you are taking blood thinners: ? Talk with your doctor before you take any medicines that have aspirin, or NSAIDs, such as ibuprofen. ? Take medicines exactly as told. Take them at the same time each day. ? Avoid doing things that could hurt or bruise you. Take action to prevent falls. ? Wear an alert bracelet or carry a card that shows you are taking blood thinners. Lifestyle  Get regular exercise. Ask your doctor about how to stay active.    Talk with your doctor about keeping a healthy weight. If needed, ask about losing weight.  Eat a diet that is low in fat and cholesterol. If you need help, talk with your doctor.  Do not drink alcohol.  Do not smoke or use any products that contain nicotine or tobacco. If you need help quitting, ask your doctor.      General instructions  Take good care of your feet. To do this: ? Wear shoes that fit well and feel good. ? Check your feet often for any cuts or  sores.  Get a flu shot (influenza vaccine) each year.  Keep all follow-up visits. Where to find more information  Society for Vascular Surgery: vascular.org  American Heart Association: heart.org  National Heart, Lung, and Blood Institute: https://www.[place]man-[place].biz/ Contact a doctor if:  You have cramps in your legs when you walk.  You have leg pain when you rest.  Your leg or foot feels cold.  Your skin changes.  You cannot get or keep an erection.  You have cuts or sores on your legs or feet that do not heal. Get help right away if:  You have sudden changes in the color and feeling of your arms or legs, such as: ? Your arm or leg turns cold, numb, and blue. ? Your arm or leg becomes red, warm, swollen, painful, or numb.  You have any signs of a stroke. "BE FAST" is an easy way to remember the main warning signs: ? B - Balance. Dizziness, sudden trouble walking, or loss of balance. ? E - Eyes. Trouble seeing or a change in how you see. ? F - Face. Sudden weakness or loss of feeling of the face. The face or eyelid may droop on one side. ? A - Arms. Weakness or loss of feeling in an arm. This happens all of a sudden and most often on one side of the body. ? S - Speech. Sudden trouble speaking, slurred speech, or trouble understanding what people say. ? T - Time. Time to call emergency services. Write down what time symptoms started.  You have other signs of a stroke, such as: ? A sudden, very bad headache with no known cause. ? Feeling like you may vomit (nausea). ? Vomiting. ? A seizure.  You have chest pain or trouble breathing. These symptoms may be an emergency. Get help right away. Call your local emergency services (911 in the [place]  Do not wait to see if the symptoms will go away.  Do not drive yourself to the hospital. Summary  Peripheral vascular disease (PVD) is a disease of the blood vessels.  PVD affects the legs and feet the most.  Symptoms may include leg  pain or leg numbness, tingling, and weakness.  Treatment may include lifestyle changes, medicines, and procedures. This information is not intended to replace advice given to you by your health care provider. Make sure you discuss any questions you have with your health care provider. Document Revised: 08/01/2019 Document Reviewed: 08/01/2019 Elsevier Patient Education  Regino Ramirez about your medication: Statin (cholesterol-lowering agent)  Generic Name (Brand):  rosuvastatin (Crestor),  PURPOSE: You are taking this medication to lower your "bad" cholesterol (LDL) and to prevent heart attacks and strokes. Statins can also raise your "good" cholesterol (HDL).  Common SIDE EFFECTS you may experience include: muscle pain or weakness (especially in the legs) and upset stomach.  Take your medication exactly as prescribed. Do not eat large amounts of  grapefruit or grapefruit juice while taking this medication.  Contact your health care provider if you experience: severe muscle pain that does not improve, dark urine, or yellowing of your skin or eyes. Information about your medication: Plavix (anti-platelet agent)  Generic Name (Brand): clopidogrel (Plavix), once daily medication  PURPOSE: You are taking this medication along with aspirin to lower your chance of having  blood clots in your stent. These can be fatal. Plavix and aspirin help prevent platelets from sticking together and forming a clot that can block an artery or your stent.   Common SIDE EFFECTS you may experience include: bruising or bleeding more easily, shortness of breath  Do not stop taking PLAVIX without talking to the doctor who prescribes it for you. People who are treated with a stent and stop taking Plavix too soon, have a higher risk of getting a blood clot in the stent.   Tell all of your doctors and dentists that you are taking Plavix. They should talk to the doctor who prescribed Plavix for you  before you have any surgery or invasive procedure.   Contact your health care provider if you experience: severe or uncontrollable bleeding, pink/red/brown urine, vomiting blood or vomit that looks like "coffee grounds", red or black stools (looks like tar), coughing up blood or blood clots ----------------------------------------------------------------------------------------------------------------------   ----------------------------------------------------------------------------------------------------------------------

## 2020-06-21 ENCOUNTER — Encounter (HOSPITAL_COMMUNITY): Payer: Medicare Other

## 2020-06-22 ENCOUNTER — Ambulatory Visit (HOSPITAL_BASED_OUTPATIENT_CLINIC_OR_DEPARTMENT_OTHER)
Admission: RE | Admit: 2020-06-22 | Discharge: 2020-06-22 | Disposition: A | Discharge status: Home / Self Care | Payer: Medicare Other | Source: Ambulatory Visit | Attending: Cardiovascular Disease | Admitting: Cardiovascular Disease

## 2020-06-22 ENCOUNTER — Other Ambulatory Visit: Payer: Self-pay

## 2020-06-22 ENCOUNTER — Ambulatory Visit (HOSPITAL_COMMUNITY)
Admission: RE | Admit: 2020-06-22 | Discharge: 2020-06-22 | Disposition: A | Discharge status: Home / Self Care | Payer: Medicare Other | Source: Ambulatory Visit | Attending: Cardiology | Admitting: Cardiology

## 2020-06-22 ENCOUNTER — Other Ambulatory Visit (HOSPITAL_COMMUNITY): Payer: Self-pay | Admitting: Cardiovascular Disease

## 2020-06-22 DIAGNOSIS — Z95820 Peripheral vascular angioplasty status with implants and grafts: Secondary | ICD-10-CM | POA: Diagnosis not present

## 2020-06-22 DIAGNOSIS — Z720 Tobacco use: Secondary | ICD-10-CM | POA: Diagnosis not present

## 2020-06-22 DIAGNOSIS — Z95828 Presence of other vascular implants and grafts: Secondary | ICD-10-CM

## 2020-06-22 DIAGNOSIS — I739 Peripheral vascular disease, unspecified: Secondary | ICD-10-CM

## 2020-07-04 ENCOUNTER — Encounter: Payer: Self-pay | Admitting: Cardiovascular Disease

## 2020-07-04 ENCOUNTER — Ambulatory Visit (INDEPENDENT_AMBULATORY_CARE_PROVIDER_SITE_OTHER): Payer: Medicare Other | Admitting: Cardiovascular Disease

## 2020-07-04 ENCOUNTER — Other Ambulatory Visit: Payer: Self-pay

## 2020-07-04 VITALS — BP 132/68 | HR 68 | Ht 68.0 in | Wt 162.0 lb

## 2020-07-04 DIAGNOSIS — Z720 Tobacco use: Secondary | ICD-10-CM

## 2020-07-04 DIAGNOSIS — I739 Peripheral vascular disease, unspecified: Secondary | ICD-10-CM

## 2020-07-04 NOTE — Assessment & Plan Note (Signed)
History of essential hypertension blood pressure today measured in the office at 132/68.  He is on lisinopril.

## 2020-07-04 NOTE — Assessment & Plan Note (Signed)
History of PAD with long history of claudication status post recent PV angiogram performed by myself 06/14/2020 with 80 and 99% right and left calcified ostial common iliac artery stenoses.  He underwent orbital atherectomy, and VBX covered stenting bilaterally with excellent result.  His follow-up Dopplers performed 06/22/2020 revealed normal ABIs with widely patent iliacs.  His claudication has completely resolved.  He is on dual antiplatelet therapy.

## 2020-07-04 NOTE — Progress Notes (Signed)
07/04/2020 Steven Perry   12/09/1950  [id number]  Primary Physician Mateo Flow, MD Primary Cardiologist: Lorretta Harp MD [name], [name]  HPI:  Steven Perry is a 70 y.o.   thin appearing married Caucasian male father of 2 children, grandfather of 2 grandchildren referred by Dr.Jaber Weston Settle for evaluation treatment of lifestyle limiting claudication. Patient is accompanied by his wife [name] today.   I last saw him in the office 06/08/2020.[name]  He is a retired Dealer. He does have a history of tobacco abuse having smoked 25 pack years currently smoking 1/2 pack/day. History of hypertension, diabetes and hyperlipidemia. His mother had stents in her 42s and his brother has had stents as well. He is never had a heart attack or stroke. He denies chest pain but does get shortness of breath because of COPD. He has a serum creatinine that runs in the 1.8 range. He has had left greater than right lower extremity lifestyle limiting claudication for the last 2 years which is lifestyle limiting.  I performed lower extremity arterial Doppler studies on him 05/28/2020 revealing a right ABI of 0.99 and a left of 0.69.  He did have a high-frequency signal in his left common iliac artery.  I performed peripheral angiography and intervention on him 06/14/2020.  He had high-grade bilateral calcified ostial iliac stenoses.  Performed orbital atherectomy, VBX covered stenting using "kissing stent technique" with excellent angiographic and clinical result.  His Dopplers performed 06/22/2020 revealed normal ABIs bilaterally with widely patent iliac arteries.  His claudication has completely resolved.  He also stopped smoking at the time of his procedure.   Current Meds  Medication Sig  . ALPRAZolam (XANAX) 0.5 MG tablet Take 0.5 mg by mouth at bedtime.  [name] aspirin EC 81 MG EC tablet Take 1 tablet (81 mg total) by mouth daily. Swallow whole.  . budesonide-formoterol (SYMBICORT)  160-4.5 MCG/ACT inhaler Inhale 2 puffs into the lungs 2 (two) times daily as needed (respiratory issues.).  [name] clopidogrel (PLAVIX) 75 MG tablet Take 1 tablet (75 mg total) by mouth daily.  . fluticasone (FLONASE) 50 MCG/ACT nasal spray Place 1 spray into both nostrils daily as needed for allergies or rhinitis.  [name] glimepiride (AMARYL) 2 MG tablet Take 2 mg by mouth in the morning.  [name] lisinopril (ZESTRIL) 5 MG tablet Take 5 mg by mouth in the morning.  [name] oxymetazoline (AFRIN) 0.05 % nasal spray Place 1 spray into both nostrils 2 (two) times daily as needed for congestion.  . pioglitazone (ACTOS) 30 MG tablet Take 30 mg by mouth in the morning.  [name] QUEtiapine (SEROQUEL) 50 MG tablet Take 50 mg by mouth at bedtime.  . rosuvastatin (CRESTOR) 10 MG tablet Take 2 tablets (20 mg total) by mouth daily.  . traMADol-acetaminophen (ULTRACET) 37.5-325 MG tablet Take 1 tablet by mouth every 6 (six) hours as needed (pain).   Current Facility-Administered Medications for the 07/04/20 encounter (Office Visit) with Lorretta Harp, MD  Medication  . sodium chloride flush (NS) 0.9 % injection 3 mL     Allergies  Allergen Reactions  . Ibuprofen Itching  . Nsaids Other (See Comments)    Renal insufficiency  . Statins Rash    Tolerates current Crestor dose    Social History   Socioeconomic History  . Marital status: Married    Spouse name: Not on file  . Number of children: Not on file  . Years of education: Not on file  .  Highest education level: Not on file  Occupational History  . Not on file  Tobacco Use  . Smoking status: Current Every Day Smoker  . Smokeless tobacco: Never Used  Substance and Sexual Activity  . Alcohol use: Not on file  . Drug use: Not on file  . Sexual activity: Not on file  Other Topics Concern  . Not on file  Social History Narrative  . Not on file   Social Determinants of Health   Financial Resource Strain: Not on file  Food Insecurity: Not on file   Transportation Needs: Not on file  Physical Activity: Not on file  Stress: Not on file  Social Connections: Not on file  Intimate Partner Violence: Not on file     Review of Systems: General: negative for chills, fever, night sweats or weight changes.  Cardiovascular: negative for chest pain, dyspnea on exertion, edema, orthopnea, palpitations, paroxysmal nocturnal dyspnea or shortness of breath Dermatological: negative for rash Respiratory: negative for cough or wheezing Urologic: negative for hematuria Abdominal: negative for nausea, vomiting, diarrhea, bright red blood per rectum, melena, or hematemesis Neurologic: negative for visual changes, syncope, or dizziness All other systems reviewed and are otherwise negative except as noted above.    Blood pressure 132/68, pulse 68, height 5\' 8"  (1.727 m), weight 162 lb (73.5 kg), SpO2 98 %.  General appearance: alert and no distress Neck: no adenopathy, no carotid bruit, no JVD, supple, symmetrical, trachea midline and thyroid not enlarged, symmetric, no tenderness/mass/nodules Lungs: clear to auscultation bilaterally Heart: regular rate and rhythm, S1, S2 normal, no murmur, click, rub or gallop Extremities: extremities normal, atraumatic, no cyanosis or edema Pulses: 2+ and symmetric Skin: Skin color, texture, turgor normal. No rashes or lesions Neurologic: Alert and oriented X 3, normal strength and tone. Normal symmetric reflexes. Normal coordination and gait  EKG not performed today  ASSESSMENT AND PLAN:   Tobacco abuse Recently discontinue tobacco abuse  Essential hypertension History of essential hypertension blood pressure today measured in the office at 132/68.  He is on lisinopril.  Hyperlipidemia History of hyperlipidemia on rosuvastatin 20 mg recently increased from 10 mg with recent lipid profile performed 06/08/2020 revealing total cholesterol 172, LDL 107 and HDL 42.  We will recheck a fasting lipid liver profile  in 2 months.  Peripheral arterial disease (HCC) History of PAD with long history of claudication status post recent PV angiogram performed by myself 06/14/2020 with 80 and 99% right and left calcified ostial common iliac artery stenoses.  He underwent orbital atherectomy, and VBX covered stenting bilaterally with excellent result.  His follow-up Dopplers performed 06/22/2020 revealed normal ABIs with widely patent iliacs.  His claudication has completely resolved.  He is on dual antiplatelet therapy.      Lorretta Harp MD FACP,FACC,FAHA, [facility] 07/04/2020 3:47 PM

## 2020-07-04 NOTE — Patient Instructions (Signed)
Medication Instructions:  Your physician recommends that you continue on your current medications as directed. Please refer to the Current Medication list given to you today. *If you need a refill on your cardiac medications before your next appointment, please call your pharmacy*   Lab Work: Your physician recommends that you return for lab work in: 2 months for fasting lipid/liver profile.  If you have labs (blood work) drawn today and your tests are completely normal, you will receive your results only by: [name] MyChart Message (if you have MyChart) OR . A paper copy in the mail If you have any lab test that is abnormal or we need to change your treatment, we will call you to review the results.   Testing/Procedures: Dr. Gwenlyn Found has recommended that you have an Ultrasound of your AORTA/IVC/ILIACS.   To prepare for this test:  . No food after 11PM the night before. Water is OK. (Don't drink liquids if you have been instructed not to for ANOTHER test).  . Avoid foods that produce bowel gas, for 24 hours prior to exam (see below). . No breakfast, no chewing gum, no smoking or carbonated beverages. . Patient may take morning medications with water. . Come in for test at least 15 minutes early to register.  Your physician has requested that you have an ankle brachial index (ABI). During this test an ultrasound and blood pressure cuff are used to evaluate the arteries that supply the arms and legs with blood. Allow thirty minutes for this exam. There are no restrictions or special instructions.  These procedures are done at [facility]. 2nd Floor.  To be done in November 2022.   Follow-Up: At [facility], you and your health needs are our priority.  As part of our continuing mission to provide you with exceptional heart care, we have created designated Provider Care Teams.  These Care Teams include your primary Cardiologist (physician) and Advanced Practice Providers (APPs -   Physician Assistants and Nurse Practitioners) who all work together to provide you with the care you need, when you need it.  We recommend signing up for the patient portal called "MyChart".  Sign up information is provided on this After Visit Summary.  MyChart is used to connect with patients for Virtual Visits (Telemedicine).  Patients are able to view lab/test results, encounter notes, upcoming appointments, etc.  Non-urgent messages can be sent to your provider as well.   To learn more about what you can do with MyChart, go to [email or web address].    Your next appointment:   6 month(s)  The format for your next appointment:   In Person  Provider:   [name], MD

## 2020-07-04 NOTE — Assessment & Plan Note (Signed)
History of hyperlipidemia on rosuvastatin 20 mg recently increased from 10 mg with recent lipid profile performed 06/08/2020 revealing total cholesterol 172, LDL 107 and HDL 42.  We will recheck a fasting lipid liver profile in 2 months.

## 2020-07-04 NOTE — Assessment & Plan Note (Signed)
Recently discontinue tobacco abuse

## 2020-07-13 ENCOUNTER — Other Ambulatory Visit: Payer: Self-pay

## 2020-07-13 ENCOUNTER — Ambulatory Visit (INDEPENDENT_AMBULATORY_CARE_PROVIDER_SITE_OTHER)
Admission: RE | Admit: 2020-07-13 | Discharge: 2020-07-13 | Disposition: A | Discharge status: Home / Self Care | Payer: Self-pay | Source: Ambulatory Visit | Attending: Cardiovascular Disease | Admitting: Cardiovascular Disease

## 2020-07-13 DIAGNOSIS — I739 Peripheral vascular disease, unspecified: Secondary | ICD-10-CM

## 2020-07-13 DIAGNOSIS — E782 Mixed hyperlipidemia: Secondary | ICD-10-CM

## 2020-08-01 DIAGNOSIS — E860 Dehydration: Secondary | ICD-10-CM | POA: Diagnosis not present

## 2020-08-01 DIAGNOSIS — R918 Other nonspecific abnormal finding of lung field: Secondary | ICD-10-CM | POA: Diagnosis not present

## 2020-08-01 DIAGNOSIS — J392 Other diseases of pharynx: Secondary | ICD-10-CM | POA: Diagnosis not present

## 2020-08-01 DIAGNOSIS — I700 Atherosclerosis of aorta: Secondary | ICD-10-CM | POA: Diagnosis not present

## 2020-08-01 DIAGNOSIS — J984 Other disorders of lung: Secondary | ICD-10-CM | POA: Diagnosis not present

## 2020-08-01 DIAGNOSIS — I6523 Occlusion and stenosis of bilateral carotid arteries: Secondary | ICD-10-CM | POA: Diagnosis not present

## 2020-08-01 DIAGNOSIS — M47812 Spondylosis without myelopathy or radiculopathy, cervical region: Secondary | ICD-10-CM | POA: Diagnosis not present

## 2020-08-01 DIAGNOSIS — K222 Esophageal obstruction: Secondary | ICD-10-CM | POA: Diagnosis not present

## 2020-08-01 DIAGNOSIS — R1310 Dysphagia, unspecified: Secondary | ICD-10-CM | POA: Diagnosis not present

## 2020-08-02 DIAGNOSIS — K219 Gastro-esophageal reflux disease without esophagitis: Secondary | ICD-10-CM | POA: Diagnosis not present

## 2020-08-02 DIAGNOSIS — R1310 Dysphagia, unspecified: Secondary | ICD-10-CM | POA: Diagnosis not present

## 2020-08-02 DIAGNOSIS — R935 Abnormal findings on diagnostic imaging of other abdominal regions, including retroperitoneum: Secondary | ICD-10-CM | POA: Diagnosis not present

## 2020-08-03 DIAGNOSIS — K222 Esophageal obstruction: Secondary | ICD-10-CM | POA: Diagnosis not present

## 2020-08-07 DIAGNOSIS — E782 Mixed hyperlipidemia: Secondary | ICD-10-CM | POA: Diagnosis not present

## 2020-08-07 DIAGNOSIS — E1122 Type 2 diabetes mellitus with diabetic chronic kidney disease: Secondary | ICD-10-CM | POA: Diagnosis not present

## 2020-08-07 DIAGNOSIS — N183 Chronic kidney disease, stage 3 unspecified: Secondary | ICD-10-CM | POA: Diagnosis not present

## 2020-08-07 DIAGNOSIS — I129 Hypertensive chronic kidney disease with stage 1 through stage 4 chronic kidney disease, or unspecified chronic kidney disease: Secondary | ICD-10-CM | POA: Diagnosis not present

## 2020-08-07 DIAGNOSIS — E1129 Type 2 diabetes mellitus with other diabetic kidney complication: Secondary | ICD-10-CM | POA: Diagnosis not present

## 2020-08-07 DIAGNOSIS — Z87442 Personal history of urinary calculi: Secondary | ICD-10-CM | POA: Diagnosis not present

## 2020-08-14 DIAGNOSIS — E1121 Type 2 diabetes mellitus with diabetic nephropathy: Secondary | ICD-10-CM | POA: Diagnosis not present

## 2020-08-14 DIAGNOSIS — Z79899 Other long term (current) drug therapy: Secondary | ICD-10-CM | POA: Diagnosis not present

## 2020-08-14 DIAGNOSIS — E782 Mixed hyperlipidemia: Secondary | ICD-10-CM | POA: Diagnosis not present

## 2020-08-14 DIAGNOSIS — I739 Peripheral vascular disease, unspecified: Secondary | ICD-10-CM | POA: Diagnosis not present

## 2020-08-14 DIAGNOSIS — N183 Chronic kidney disease, stage 3 unspecified: Secondary | ICD-10-CM | POA: Diagnosis not present

## 2020-09-07 DIAGNOSIS — Z87898 Personal history of other specified conditions: Secondary | ICD-10-CM | POA: Diagnosis not present

## 2020-09-07 DIAGNOSIS — R1310 Dysphagia, unspecified: Secondary | ICD-10-CM | POA: Diagnosis not present

## 2020-09-07 DIAGNOSIS — J392 Other diseases of pharynx: Secondary | ICD-10-CM | POA: Diagnosis not present

## 2020-09-07 DIAGNOSIS — Z87891 Personal history of nicotine dependence: Secondary | ICD-10-CM | POA: Diagnosis not present

## 2020-09-07 DIAGNOSIS — H9319 Tinnitus, unspecified ear: Secondary | ICD-10-CM | POA: Diagnosis not present

## 2020-09-07 DIAGNOSIS — J342 Deviated nasal septum: Secondary | ICD-10-CM | POA: Diagnosis not present

## 2020-09-07 DIAGNOSIS — J343 Hypertrophy of nasal turbinates: Secondary | ICD-10-CM | POA: Diagnosis not present

## 2020-09-07 DIAGNOSIS — R0981 Nasal congestion: Secondary | ICD-10-CM | POA: Diagnosis not present

## 2020-09-12 ENCOUNTER — Other Ambulatory Visit: Payer: Self-pay

## 2020-09-12 DIAGNOSIS — K222 Esophageal obstruction: Secondary | ICD-10-CM | POA: Diagnosis not present

## 2020-09-12 DIAGNOSIS — K219 Gastro-esophageal reflux disease without esophagitis: Secondary | ICD-10-CM | POA: Diagnosis not present

## 2020-09-12 MED ORDER — CLOPIDOGREL BISULFATE 75 MG PO TABS: 75.0000 mg | ORAL_TABLET | Freq: Every day | ORAL | 1 refills | Status: DC

## 2020-09-19 ENCOUNTER — Other Ambulatory Visit: Payer: Self-pay | Admitting: *Deleted

## 2020-09-19 ENCOUNTER — Encounter: Payer: Self-pay | Admitting: *Deleted

## 2020-09-19 DIAGNOSIS — E782 Mixed hyperlipidemia: Secondary | ICD-10-CM

## 2020-09-24 ENCOUNTER — Telehealth: Payer: Self-pay | Admitting: Cardiovascular Disease

## 2020-09-24 NOTE — Telephone Encounter (Signed)
Patient called in to say that he had lab work done at his family physician with Dr. Humphrey Rolls. [phone] and fax [phone]. He is requesting that our office get the lab info from them. Please advise

## 2020-09-25 NOTE — Telephone Encounter (Signed)
Left message with Dr. Laurelyn Sickle nurse to please fax most recent blood work over to our office for Dr. Gwenlyn Found to review.

## 2020-11-09 DIAGNOSIS — F419 Anxiety disorder, unspecified: Secondary | ICD-10-CM | POA: Diagnosis not present

## 2020-11-09 DIAGNOSIS — E1121 Type 2 diabetes mellitus with diabetic nephropathy: Secondary | ICD-10-CM | POA: Diagnosis not present

## 2020-11-09 DIAGNOSIS — E782 Mixed hyperlipidemia: Secondary | ICD-10-CM | POA: Diagnosis not present

## 2020-11-09 DIAGNOSIS — J449 Chronic obstructive pulmonary disease, unspecified: Secondary | ICD-10-CM | POA: Diagnosis not present

## 2020-11-09 DIAGNOSIS — Z23 Encounter for immunization: Secondary | ICD-10-CM | POA: Diagnosis not present

## 2020-12-03 DIAGNOSIS — L821 Other seborrheic keratosis: Secondary | ICD-10-CM | POA: Diagnosis not present

## 2020-12-03 DIAGNOSIS — L578 Other skin changes due to chronic exposure to nonionizing radiation: Secondary | ICD-10-CM | POA: Diagnosis not present

## 2020-12-03 DIAGNOSIS — L570 Actinic keratosis: Secondary | ICD-10-CM | POA: Diagnosis not present

## 2020-12-03 DIAGNOSIS — L820 Inflamed seborrheic keratosis: Secondary | ICD-10-CM | POA: Diagnosis not present

## 2020-12-14 DIAGNOSIS — Z23 Encounter for immunization: Secondary | ICD-10-CM | POA: Diagnosis not present

## 2020-12-17 ENCOUNTER — Encounter (HOSPITAL_COMMUNITY): Payer: Medicare Other

## 2020-12-17 ENCOUNTER — Inpatient Hospital Stay (HOSPITAL_COMMUNITY): Admission: RE | Admit: 2020-12-17 | Payer: Medicare Other | Source: Ambulatory Visit

## 2021-01-02 ENCOUNTER — Ambulatory Visit (HOSPITAL_BASED_OUTPATIENT_CLINIC_OR_DEPARTMENT_OTHER)
Admission: RE | Admit: 2021-01-02 | Discharge: 2021-01-02 | Disposition: A | Discharge status: Home / Self Care | Payer: Medicare Other | Source: Ambulatory Visit | Attending: Cardiovascular Disease | Admitting: Cardiovascular Disease

## 2021-01-02 ENCOUNTER — Other Ambulatory Visit: Payer: Self-pay

## 2021-01-02 ENCOUNTER — Ambulatory Visit (HOSPITAL_COMMUNITY)
Admission: RE | Admit: 2021-01-02 | Discharge: 2021-01-02 | Disposition: A | Discharge status: Home / Self Care | Payer: Medicare Other | Source: Ambulatory Visit | Attending: Cardiovascular Disease | Admitting: Cardiovascular Disease

## 2021-01-02 DIAGNOSIS — I739 Peripheral vascular disease, unspecified: Secondary | ICD-10-CM | POA: Insufficient documentation

## 2021-01-02 DIAGNOSIS — Z95828 Presence of other vascular implants and grafts: Secondary | ICD-10-CM | POA: Diagnosis not present

## 2021-01-02 DIAGNOSIS — Z720 Tobacco use: Secondary | ICD-10-CM | POA: Diagnosis not present

## 2021-01-09 ENCOUNTER — Other Ambulatory Visit: Payer: Self-pay

## 2021-01-09 ENCOUNTER — Ambulatory Visit (INDEPENDENT_AMBULATORY_CARE_PROVIDER_SITE_OTHER): Payer: Medicare Other | Admitting: Cardiovascular Disease

## 2021-01-09 ENCOUNTER — Encounter: Payer: Self-pay | Admitting: Cardiovascular Disease

## 2021-01-09 DIAGNOSIS — I739 Peripheral vascular disease, unspecified: Secondary | ICD-10-CM

## 2021-01-09 DIAGNOSIS — Z720 Tobacco use: Secondary | ICD-10-CM | POA: Diagnosis not present

## 2021-01-09 DIAGNOSIS — E782 Mixed hyperlipidemia: Secondary | ICD-10-CM

## 2021-01-09 DIAGNOSIS — I10 Essential (primary) hypertension: Secondary | ICD-10-CM

## 2021-01-09 NOTE — Assessment & Plan Note (Signed)
History of peripheral arterial disease with lifestyle limiting claudication status post orbital atherectomy, VBX and covered stenting of both iliac arteries using "kissing stent technique with an excellent angiographic result.  His most recent Dopplers performed 01/02/2021 revealed the stent to be widely patent with normal ABIs.  He denies claudication.  We will repeat his Dopplers in 12 months.

## 2021-01-09 NOTE — Assessment & Plan Note (Signed)
History of essential hypertension a blood pressure measured today 138/64.  He is on lisinopril.

## 2021-01-09 NOTE — Assessment & Plan Note (Signed)
History of hyperlipidemia intolerant to statin therapy and Zetia with lipid profile performed 08/14/2020 revealing total cholesterol of 158, LDL 84 and HDL 70.  He would be a good candidate for Repatha which we will explore.

## 2021-01-09 NOTE — Progress Notes (Signed)
01/09/2021 Steven Perry   July 20, 1950  [id number]  Primary Physician Steven Flow, MD Primary Cardiologist: Steven Harp MD Steven Perry, [name]  HPI:  Steven Perry is a 70 y.o.  thin appearing married Caucasian male father of 2 children, grandfather of 2 grandchildren referred by Steven Perry in [place] for evaluation treatment of lifestyle limiting claudication.  Patient is accompanied by his wife Steven Perry today.  I last saw him in the office 07/04/2020.[name]  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  History of hypertension, diabetes and hyperlipidemia.  His mother had stents in her 51s and his brother has had stents as well.  He is never had a heart attack or stroke.  He denies chest pain but does get shortness of breath because of COPD.  He has a serum creatinine that runs in the 1.8 range.  He has had left greater than right lower extremity lifestyle limiting claudication for the last 2 years which is lifestyle limiting.   I performed lower extremity arterial Doppler studies on him 05/28/2020 revealing a right ABI of 0.99 and a left of 0.69.  He did have a high-frequency signal in his left common iliac artery.   I performed peripheral angiography and intervention on him 06/14/2020.  He had high-grade bilateral calcified ostial iliac stenoses.  Performed orbital atherectomy, VBX covered stenting using "kissing stent technique" with excellent angiographic and clinical result.  His Dopplers performed 06/22/2020 revealed normal ABIs bilaterally with widely patent iliac arteries.  His claudication has completely resolved.  He also stopped smoking at the time of his procedure.  Since I saw him 6 months ago he continues to do well.  He no longer has claudication.  His most recent Dopplers performed 01/02/2021 revealed patent iliac stents with normal ABIs.  He denies chest pain but does have mild dyspnea when  exerting himself probably related to his longstanding tobacco abuse.  He also has been complaining of some arthralgias and myalgias consistent with statin intolerance which resolved when stopping his statin drug.   Current Meds  Medication Sig   ALPRAZolam (XANAX) 0.5 MG tablet Take 0.5 mg by mouth at bedtime.   aspirin EC 81 MG EC tablet Take 1 tablet (81 mg total) by mouth daily. Swallow whole.   budesonide-formoterol (SYMBICORT) 160-4.5 MCG/ACT inhaler Inhale 2 puffs into the lungs 2 (two) times daily as needed (respiratory issues.).   clopidogrel (PLAVIX) 75 MG tablet Take 1 tablet (75 mg total) by mouth daily.   Coenzyme Q10 (CO Q 10) 100 MG CAPS Take 10 mg by mouth daily in the afternoon.   fluticasone (FLONASE) 50 MCG/ACT nasal spray Place 1 spray into both nostrils daily as needed for allergies or rhinitis.   glimepiride (AMARYL) 2 MG tablet Take 2 mg by mouth in the morning.   lisinopril (ZESTRIL) 5 MG tablet Take 5 mg by mouth in the morning.   oxymetazoline (AFRIN) 0.05 % nasal spray Place 1 spray into both nostrils 2 (two) times daily as needed for congestion.   pioglitazone (ACTOS) 30 MG tablet Take 30 mg by mouth in the morning.   QUEtiapine (SEROQUEL) 50 MG tablet Take 50 mg by mouth at bedtime.   rosuvastatin (CRESTOR) 10 MG tablet Take 2 tablets (20 mg total) by mouth daily.   traMADol-acetaminophen (ULTRACET) 37.5-325 MG tablet Take 1 tablet by mouth every 6 (six) hours as  needed (pain).   Current Facility-Administered Medications for the 01/09/21 encounter (Office Visit) with Steven Harp, MD  Medication   sodium chloride flush (NS) 0.9 % injection 3 mL     Allergies  Allergen Reactions   Ibuprofen Itching   Nsaids Other (See Comments)    Renal insufficiency   Statins Rash    Tolerates current Crestor dose    Social History   Socioeconomic History   Marital status: Married    Spouse name: Not on file   Number of children: Not on file   Years of  education: Not on file   Highest education level: Not on file  Occupational History   Not on file  Tobacco Use   Smoking status: Former    Types: Cigarettes    Quit date: 06/2020    Years since quitting: 0.5   Smokeless tobacco: Never  Substance and Sexual Activity   Alcohol use: Not on file   Drug use: Not on file   Sexual activity: Not on file  Other Topics Concern   Not on file  Social History Narrative   Not on file   Social Determinants of Health   Financial Resource Strain: Not on file  Food Insecurity: Not on file  Transportation Needs: Not on file  Physical Activity: Not on file  Stress: Not on file  Social Connections: Not on file  Intimate Partner Violence: Not on file     Review of Systems: General: negative for chills, fever, night sweats or weight changes.  Cardiovascular: negative for chest pain, dyspnea on exertion, edema, orthopnea, palpitations, paroxysmal nocturnal dyspnea or shortness of breath Dermatological: negative for rash Respiratory: negative for cough or wheezing Urologic: negative for hematuria Abdominal: negative for nausea, vomiting, diarrhea, bright red blood per rectum, melena, or hematemesis Neurologic: negative for visual changes, syncope, or dizziness All other systems reviewed and are otherwise negative except as noted above.    Blood pressure 138/64, pulse 72, height 5\' 8"  (1.727 m), weight 162 lb 12.8 oz (73.8 kg), SpO2 96 %.  General appearance: alert and no distress Neck: no adenopathy, no carotid bruit, no JVD, supple, symmetrical, trachea midline, and thyroid not enlarged, symmetric, no tenderness/mass/nodules Lungs: clear to auscultation bilaterally Heart: regular rate and rhythm, S1, S2 normal, no murmur, click, rub or gallop Extremities: extremities normal, atraumatic, no cyanosis or edema Pulses: 2+ and symmetric Skin: Skin color, texture, turgor normal. No rashes or lesions Neurologic: Grossly normal  EKG sinus rhythm  at 72 with LVH voltage and nonspecific ST and T wave changes.  I personally reviewed this EKG.  ASSESSMENT AND PLAN:   Tobacco abuse History of discontinue tobacco abuse at the time of his recent procedure in May of this year after smoking 25 pack years.  He does have some dyspnea probably related to COPD.  Essential hypertension History of essential hypertension a blood pressure measured today 138/64.  He is on lisinopril.  Hyperlipidemia History of hyperlipidemia intolerant to statin therapy and Zetia with lipid profile performed 08/14/2020 revealing total cholesterol of 158, LDL 84 and HDL 70.  He would be a good candidate for Repatha which we will explore.  Peripheral arterial disease (HCC) History of peripheral arterial disease with lifestyle limiting claudication status post orbital atherectomy, VBX and covered stenting of both iliac arteries using "kissing stent technique with an excellent angiographic result.  His most recent Dopplers performed 01/02/2021 revealed the stent to be widely patent with normal ABIs.  He denies claudication.  We  will repeat his Dopplers in 12 months.     Steven Harp MD FACP,FACC,FAHA, [facility] 01/09/2021 11:53 AM

## 2021-01-09 NOTE — Patient Instructions (Addendum)
Medication Instructions:  Your physician recommends that you continue on your current medications as directed. Please refer to the Current Medication list given to you today.  *If you need a refill on your cardiac medications before your next appointment, please call your pharmacy*   Lab Work: Your physician recommends that you return for lab work in: 3 months for FASTING lipid/liver profile.   If you have labs (blood work) drawn today and your tests are completely normal, you will receive your results only by: [place] (if you have MyChart) OR A paper copy in the mail If you have any lab test that is abnormal or we need to change your treatment, we will call you to review the results.   Testing/Procedures: Dr. Gwenlyn Found has recommended that you have an Ultrasound of your AORTA/IVC/ILIACS.   To prepare for this test:  No food after 11PM the night before. Water is OK. (Don't drink liquids if you have been instructed not to for ANOTHER test).  Avoid foods that produce bowel gas, for 24 hours prior to exam (see below). No breakfast, no chewing gum, no smoking or carbonated beverages. Patient may take morning medications with water. Come in for test at least 15 minutes early to register.  Your physician has requested that you have an ankle brachial index (ABI). During this test an ultrasound and blood pressure cuff are used to evaluate the arteries that supply the arms and legs with blood. Allow thirty minutes for this exam. There are no restrictions or special instructions.  To be done in November 2023. These procedures are done at [place].    Follow-Up: At [facility], you and your health needs are our priority.  As part of our continuing mission to provide you with exceptional heart care, we have created designated Provider Care Teams.  These Care Teams include your primary Cardiologist (physician) and Advanced Practice Providers (APPs -  Physician Assistants and Nurse  Practitioners) who all work together to provide you with the care you need, when you need it.  We recommend signing up for the patient portal called "MyChart".  Sign up information is provided on this After Visit Summary.  MyChart is used to connect with patients for Virtual Visits (Telemedicine).  Patients are able to view lab/test results, encounter notes, upcoming appointments, etc.  Non-urgent messages can be sent to your provider as well.   To learn more about what you can do with MyChart, go to [email or web address].    Your next appointment:   12 month(s)  The format for your next appointment:   In Person  Provider:   [name], MD

## 2021-01-09 NOTE — Assessment & Plan Note (Signed)
History of discontinue tobacco abuse at the time of his recent procedure in May of this year after smoking 25 pack years.  He does have some dyspnea probably related to COPD.

## 2021-01-18 ENCOUNTER — Telehealth: Payer: Self-pay | Admitting: Cardiovascular Disease

## 2021-01-18 NOTE — Telephone Encounter (Signed)
Spoke with pt regarding Leqvio.  Let pt know that Haleigh from the pharmacy will give him a call on Monday or Tuesday to set that up. Pt verbalizes understanding.

## 2021-01-18 NOTE — Telephone Encounter (Signed)
I must have had him sign the [facility] paperwork when he came in.  He was calling because it's been approved and wanted to know when we were going to call and set up his first dose appointment.  I'll have Haleigh schedule that on Monday

## 2021-01-18 NOTE — Telephone Encounter (Signed)
New Message:     Patient said Dr Gwenlyn Found had said something about him getting a shot for his Cholesterol. He said he had not heard anything,I did not see a referral for Lipid Clinic.

## 2021-01-22 ENCOUNTER — Other Ambulatory Visit: Payer: Self-pay

## 2021-01-22 DIAGNOSIS — E785 Hyperlipidemia, unspecified: Secondary | ICD-10-CM

## 2021-01-22 NOTE — Telephone Encounter (Signed)
Your Leqvio injection is scheduled on 02/07/21 at 1pm. Please arrive at [facility] and enter the hospital through [place] (the [employer]) that's located at [employer] 15 minutes before your scheduled injection time. Walk in to the registration desk and let them know that you are scheduled for an injection in the infusion center. They will register you and take you to your appointment.   Marion Downer is a subcutaneous injection that will lower your LDL cholesterol by 50%. If this is your first injection, your next injection will be due in 3 months. If this is NOT your first injection, your next injection will be due in 6 months. If you have not heard from [facility] to schedule your next appointment within 2 weeks of your next anticipated injection, please call [facility] to schedule this at:                [phone] - if you are seen at the [facility] office               [phone] - if you are seen at the [facility] office

## 2021-01-22 NOTE — Telephone Encounter (Signed)
Called and spoke w/pt's mother about scheduling leqvio injection and asked her  to have the pt call me to schedule she voiced understanding

## 2021-01-31 ENCOUNTER — Other Ambulatory Visit: Payer: Self-pay | Admitting: Cardiovascular Disease

## 2021-01-31 DIAGNOSIS — Z95828 Presence of other vascular implants and grafts: Secondary | ICD-10-CM

## 2021-01-31 DIAGNOSIS — I739 Peripheral vascular disease, unspecified: Secondary | ICD-10-CM

## 2021-02-07 ENCOUNTER — Ambulatory Visit (HOSPITAL_COMMUNITY)
Admission: RE | Admit: 2021-02-07 | Discharge: 2021-02-07 | Disposition: A | Discharge status: Home / Self Care | Payer: Medicare Other | Source: Ambulatory Visit | Attending: Cardiovascular Disease | Admitting: Cardiovascular Disease

## 2021-02-07 ENCOUNTER — Other Ambulatory Visit: Payer: Self-pay

## 2021-02-07 DIAGNOSIS — E785 Hyperlipidemia, unspecified: Secondary | ICD-10-CM | POA: Diagnosis not present

## 2021-02-07 MED ORDER — INCLISIRAN SODIUM 284 MG/1.5ML ~~LOC~~ SOSY
PREFILLED_SYRINGE | SUBCUTANEOUS | Status: AC
  Filled 2021-02-07: qty 1.5

## 2021-02-07 MED ORDER — INCLISIRAN SODIUM 284 MG/1.5ML ~~LOC~~ SOSY
284.0000 mg | PREFILLED_SYRINGE | Freq: Once | SUBCUTANEOUS | Status: AC
  Administered 2021-02-07: 13:00:00 284 mg via SUBCUTANEOUS

## 2021-02-14 ENCOUNTER — Other Ambulatory Visit: Payer: Self-pay

## 2021-02-14 DIAGNOSIS — E785 Hyperlipidemia, unspecified: Secondary | ICD-10-CM

## 2021-02-25 DIAGNOSIS — L570 Actinic keratosis: Secondary | ICD-10-CM | POA: Diagnosis not present

## 2021-02-25 DIAGNOSIS — L821 Other seborrheic keratosis: Secondary | ICD-10-CM | POA: Diagnosis not present

## 2021-03-15 ENCOUNTER — Other Ambulatory Visit: Payer: Self-pay | Admitting: Home Health

## 2021-03-28 ENCOUNTER — Encounter (HOSPITAL_COMMUNITY): Payer: Self-pay | Admitting: Cardiovascular Disease

## 2021-04-23 DIAGNOSIS — F419 Anxiety disorder, unspecified: Secondary | ICD-10-CM | POA: Diagnosis not present

## 2021-04-23 DIAGNOSIS — N183 Chronic kidney disease, stage 3 unspecified: Secondary | ICD-10-CM | POA: Diagnosis not present

## 2021-04-23 DIAGNOSIS — J069 Acute upper respiratory infection, unspecified: Secondary | ICD-10-CM | POA: Diagnosis not present

## 2021-04-23 DIAGNOSIS — E782 Mixed hyperlipidemia: Secondary | ICD-10-CM | POA: Diagnosis not present

## 2021-04-23 DIAGNOSIS — E1121 Type 2 diabetes mellitus with diabetic nephropathy: Secondary | ICD-10-CM | POA: Diagnosis not present

## 2021-04-23 DIAGNOSIS — Z0000 Encounter for general adult medical examination without abnormal findings: Secondary | ICD-10-CM | POA: Diagnosis not present

## 2021-05-02 ENCOUNTER — Other Ambulatory Visit: Payer: Self-pay

## 2021-05-02 ENCOUNTER — Telehealth: Payer: Self-pay | Admitting: Cardiovascular Disease

## 2021-05-02 ENCOUNTER — Encounter (HOSPITAL_COMMUNITY)
Admission: RE | Admit: 2021-05-02 | Discharge: 2021-05-02 | Disposition: A | Discharge status: Home / Self Care | Payer: Medicare Other | Source: Ambulatory Visit | Attending: Cardiovascular Disease | Admitting: Cardiovascular Disease

## 2021-05-02 DIAGNOSIS — E785 Hyperlipidemia, unspecified: Secondary | ICD-10-CM | POA: Diagnosis not present

## 2021-05-02 MED ORDER — INCLISIRAN SODIUM 284 MG/1.5ML ~~LOC~~ SOSY
284.0000 mg | PREFILLED_SYRINGE | Freq: Once | SUBCUTANEOUS | Status: AC
  Administered 2021-05-02: 284 mg via SUBCUTANEOUS

## 2021-05-02 MED ORDER — INCLISIRAN SODIUM 284 MG/1.5ML ~~LOC~~ SOSY
PREFILLED_SYRINGE | SUBCUTANEOUS | Status: AC
  Filled 2021-05-02: qty 1.5

## 2021-05-02 NOTE — Telephone Encounter (Signed)
Calling to make sure patient can get his shot ?

## 2021-05-02 NOTE — Telephone Encounter (Signed)
Spoke  to [place]  - patient wanted to know if he needed to have the infusion for Leqvio today .  Per patient is concerned that LDL  dropped to 102 at last lab result 03/26/21 ? ? RN informed  Anibal Henderson that will need to speak to [facility] - pharmacist ( who are managing medication for Dr Gwenlyn Found. ?RN discuss with Tommy Medal RPH-D ? Per Tommy Medal RPH-D  patient is continue with infusion Leqvio-  if he stopped taking  LDL will increase again. The goal is  to be under 71 . ? ? Hotel manager states she will  inform patient. ?

## 2021-05-06 ENCOUNTER — Other Ambulatory Visit: Payer: Self-pay

## 2021-05-06 DIAGNOSIS — E785 Hyperlipidemia, unspecified: Secondary | ICD-10-CM

## 2021-05-08 ENCOUNTER — Telehealth: Payer: Self-pay

## 2021-05-08 DIAGNOSIS — E785 Hyperlipidemia, unspecified: Secondary | ICD-10-CM

## 2021-05-08 NOTE — Telephone Encounter (Signed)
Called and spoke w/pt's mother Steven Perry to let them know that the pt needs to complete fasting lab work asap and they stated that they would relay the information to the pt. Orders for lipid profile were placed and released as requested by doctor alvstad.  ?

## 2021-05-08 NOTE — Telephone Encounter (Signed)
-----   Message from Rockne Menghini, RPH-CPP sent at 05/08/2021  8:40 AM EDT ----- ?Regarding: RE: post 2nd dose leqvio ?Just lipid profile please ? ?Thank you ?----- Message ----- ?From: Allean Found, [place] ?Sent: 05/08/2021   7:47 AM EDT ?To: [facility] ?Subject: post 2nd dose leqvio                          ? ?What labs if any would you like to order for post 2nd dose leqvio. Apparently he hasn't ever seen a pharmd which is why I sent to the general pool or I can ask berry.  ? ? ?

## 2021-05-16 DIAGNOSIS — E785 Hyperlipidemia, unspecified: Secondary | ICD-10-CM | POA: Diagnosis not present

## 2021-05-16 LAB — LIPID PANEL
Chol/HDL Ratio: 3 ratio (ref 0.0–5.0)
Cholesterol, Total: 136 mg/dL (ref 100–199)
HDL: 46 mg/dL (ref >39)
LDL Chol Calc (NIH): 73 mg/dL (ref 0–99)
Triglycerides: 90 mg/dL (ref 0–149)
VLDL Cholesterol Cal: 17 mg/dL (ref 5–40)

## 2021-05-30 ENCOUNTER — Other Ambulatory Visit: Payer: Self-pay | Admitting: Cardiovascular Disease

## 2021-08-10 IMAGING — CT CT CARDIAC CORONARY ARTERY CALCIUM SCORE
3 series · 14 of 20 positions shown, 15 images · non-contrast
Comparison: Prior CT of the chest at [facility] on
12/02/2002.
COMPARISON: Prior CT of the chest at [facility] on
12/02/2002.

Addendum:
EXAM:
OVER-READ INTERPRETATION  CT CHEST

The following report is an over-read performed by radiologist Dr.
over-read does not include interpretation of cardiac or coronary
anatomy or pathology. The coronary calcium score interpretation by
the cardiologist is attached.
CLINICAL DATA: Cardiovascular Disease Risk stratification
Coronary Calcium Score
TECHNIQUE: A gated, non-contrast computed tomography scan of the heart was
performed using 3mm slice thickness. Axial images were analyzed on a
dedicated workstation. Calcium scoring of the coronary arteries was
performed using the Agatston method.

[Series 2: casc 3.0 bv41 2 bestdiast 71 % · axial · 0.40mm/px · z∈[-266,-176]mm · 4 of 52 slices shown, 5 images]
[im 11/52  vessel]
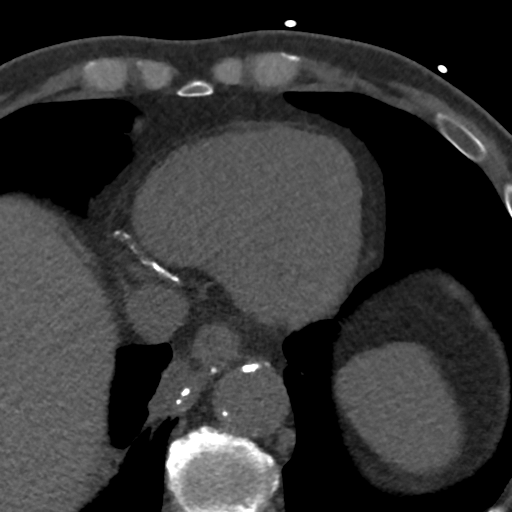
[im 11/52  lung]
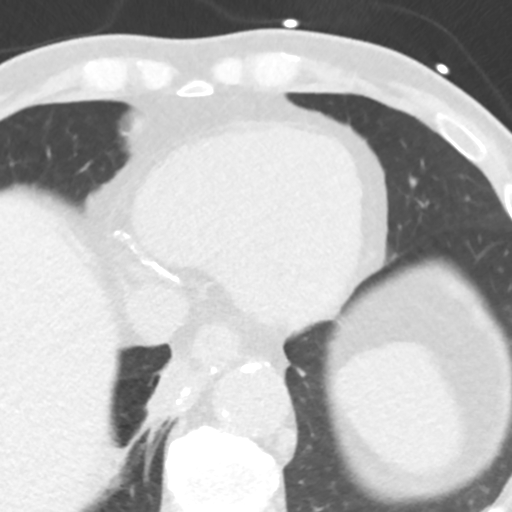
[im 21/52  vessel]
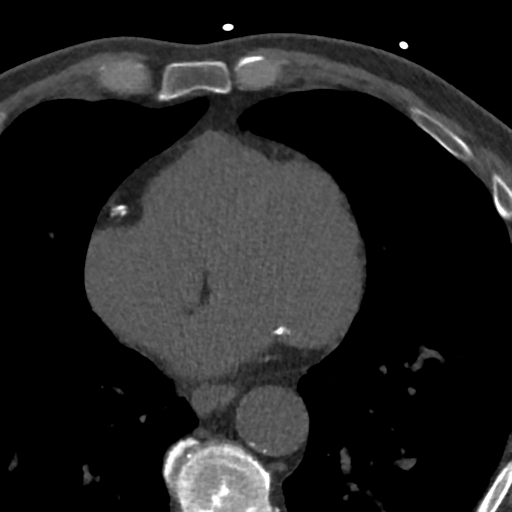
[im 31/52  vessel]
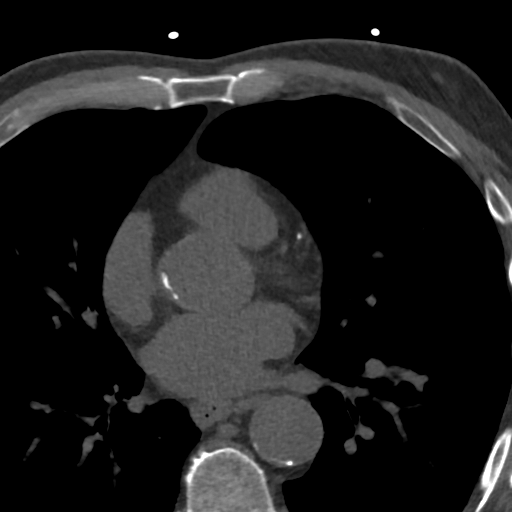
[im 41/52  vessel]
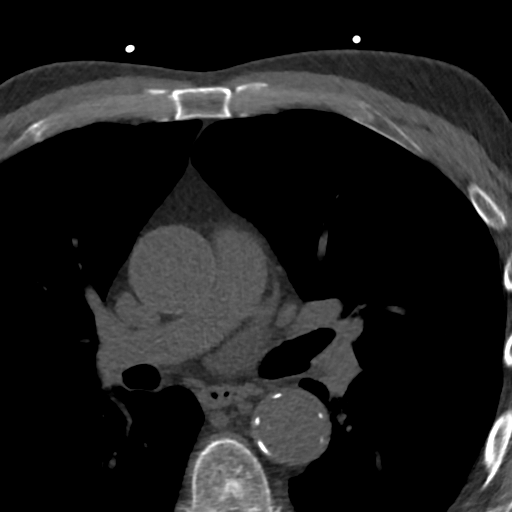

[Series 3: lung 71 % · axial · 0.68mm/px · z∈[-272,-170]mm · 5 of 52 slices shown]
[im 9/52  lung]
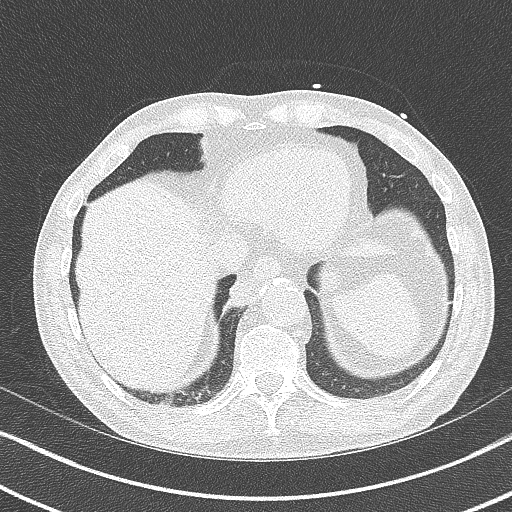
[im 18/52  lung]
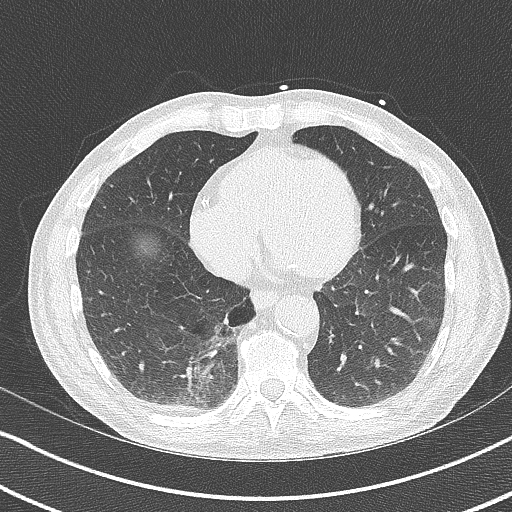
[im 26/52  lung]
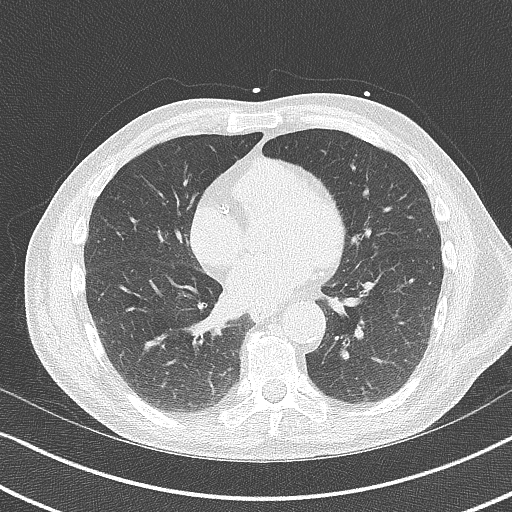
[im 35/52  lung]
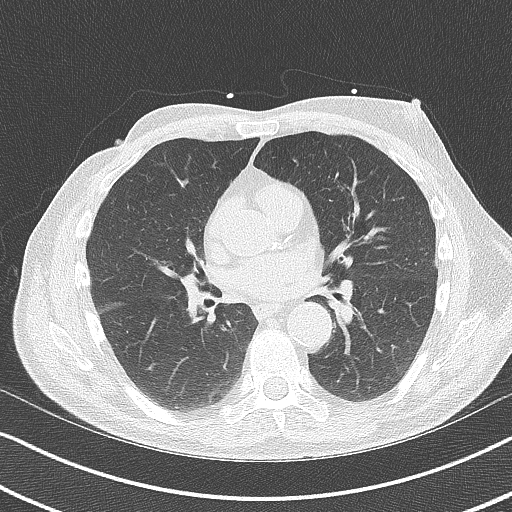
[im 43/52  lung]
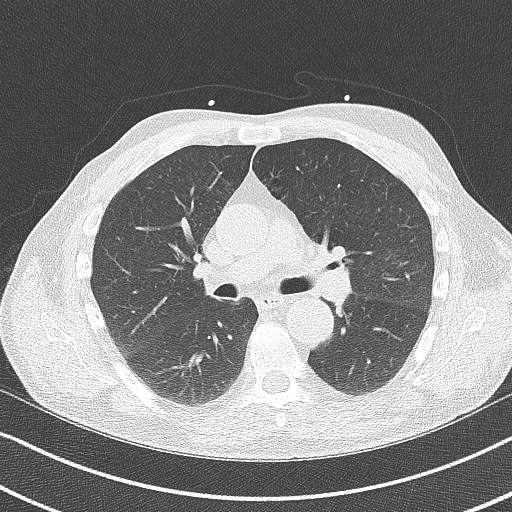

[Series 4: lung st 71 % · axial · 0.68mm/px · z∈[-272,-170]mm · 5 of 52 slices shown]
[im 9/52  lung]
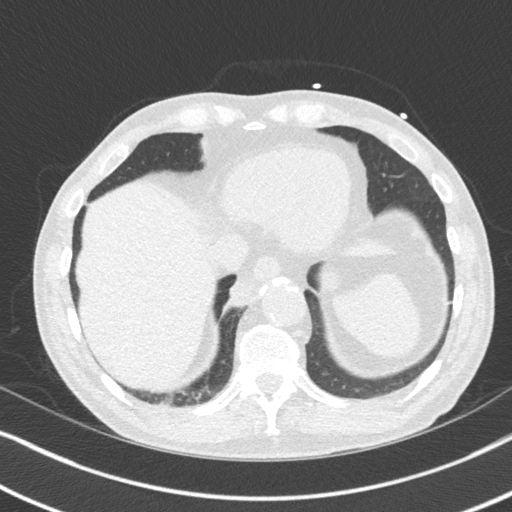
[im 18/52  lung]
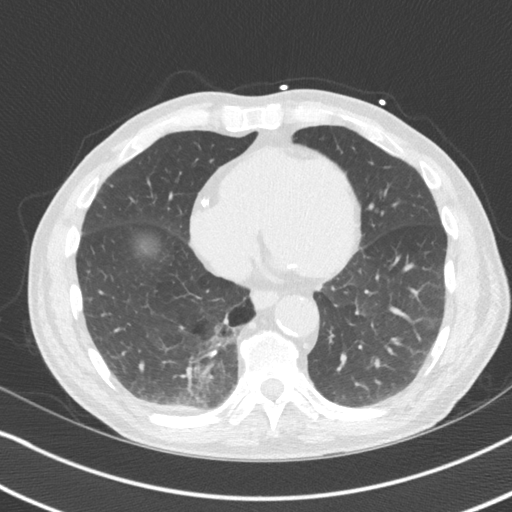
[im 26/52  lung]
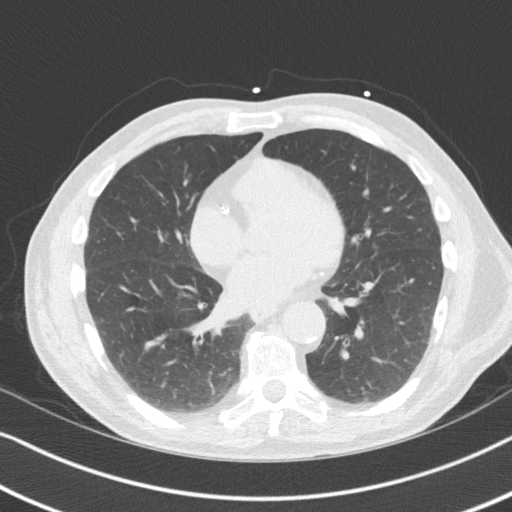
[im 35/52  lung]
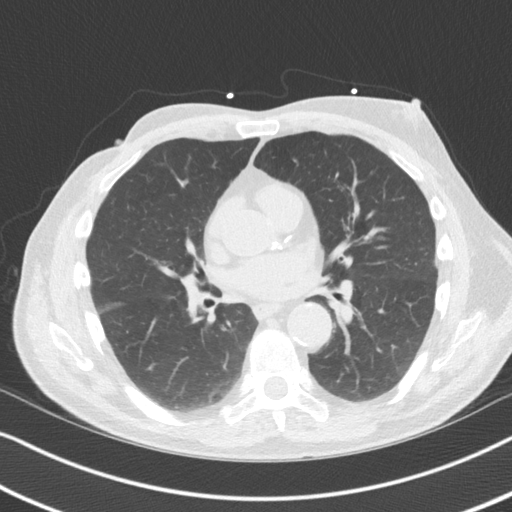
[im 43/52  lung]
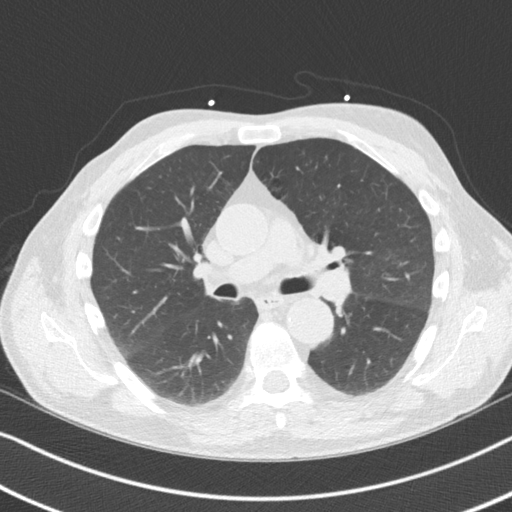

[14 of 20 positions shown; findings below may reference images not displayed]

FINDINGS: Vascular: Atherosclerosis of the visualized thoracic aorta. The
proximal descending thoracic aorta is mildly dilated and measures up
to 3.2 cm in greatest diameter. This segment measured approximately
2.8 cm in 5008.

Mediastinum/Nodes: The visualized mediastinum and hilar regions
demonstrate no lymphadenopathy or masses.

Lungs/Pleura: Visualized lungs show no evidence of pulmonary edema,
consolidation, pneumothorax, nodule or pleural fluid.

Upper Abdomen: No acute abnormality.

Musculoskeletal: No chest wall mass or suspicious bone lesions
identified.
IMPRESSION: Aortic atherosclerosis with mild dilatation of the proximal
descending thoracic aorta measuring up to 3.2 cm. This represents
slight increase since 5008 where maximal diameter was approximately
2.8 cm.

Recommend annual imaging followup by CTA or MRA. This recommendation
follows 0020 ACCF/AHA/AATS/ACR/ASA/SCA/MOMPREMIER/TOSCANO/VERNEROVA/JASRI Guidelines
for the Diagnosis and Management of Patients with Thoracic Aortic
Disease. Circulation.0020; 121: E266-e369. Aortic aneurysm NOS
(GIHLA-2VB.4)
FINDINGS: Coronary arteries: Normal origins.

Coronary Calcium Score:

Left main: 108

Left anterior descending artery: 228

Left circumflex artery: 274

Right coronary artery: 7171

Total: 6271

Percentile: 92

Pericardium: Normal.

Aorta: Normal caliber of ascending aorta. Aortic atherosclerosis
noted.

Non-cardiac: See separate report from [REDACTED].
IMPRESSION: Coronary calcium score of 6271. This was 92nd percentile for age-,
race-, and sex-matched controls. Three vessel distribution.



If CAC=0, it is reasonable to withhold statin therapy and reassess
in 5 to 10 years, as long as higher risk conditions are absent
(diabetes mellitus, family history of premature CHD in first degree
relatives (males <55 years; females <65 years), cigarette smoking,
or LDL >=190 mg/dL).

If CAC is 1 to 99, it is reasonable to initiate statin therapy for
patients >=55 years of age.

If CAC is >=100 or >=75th percentile, it is reasonable to initiate
statin therapy at any age.

Cardiology referral should be considered for patients with CAC
scores >=400 or >=75th percentile.

*1205 AHA/ACC/AACVPR/AAPA/ABC/HF WHALID KHALED/THEE/JULI/Too/CHANCEY/ISALM/JAFFELI
Guideline on the Management of Blood Cholesterol: A Report of the
American College of Cardiology/American Heart Association Task Force
on Clinical Practice Guidelines. J Am Coll Cardiol.
1598;73(24):7584-7328.

*** End of Addendum ***
EXAM:
OVER-READ INTERPRETATION  CT CHEST

The following report is an over-read performed by radiologist Dr.
over-read does not include interpretation of cardiac or coronary
anatomy or pathology. The coronary calcium score interpretation by
the cardiologist is attached.
FINDINGS: Vascular: Atherosclerosis of the visualized thoracic aorta. The
proximal descending thoracic aorta is mildly dilated and measures up
to 3.2 cm in greatest diameter. This segment measured approximately
2.8 cm in 5008.

Mediastinum/Nodes: The visualized mediastinum and hilar regions
demonstrate no lymphadenopathy or masses.

Lungs/Pleura: Visualized lungs show no evidence of pulmonary edema,
consolidation, pneumothorax, nodule or pleural fluid.

Upper Abdomen: No acute abnormality.

Musculoskeletal: No chest wall mass or suspicious bone lesions
identified.
IMPRESSION: Aortic atherosclerosis with mild dilatation of the proximal
descending thoracic aorta measuring up to 3.2 cm. This represents
slight increase since 5008 where maximal diameter was approximately
2.8 cm.

Recommend annual imaging followup by CTA or MRA. This recommendation
follows 0020 ACCF/AHA/AATS/ACR/ASA/SCA/MOMPREMIER/TOSCANO/VERNEROVA/JASRI Guidelines
for the Diagnosis and Management of Patients with Thoracic Aortic
Disease. Circulation.0020; 121: E266-e369. Aortic aneurysm NOS
(GIHLA-2VB.4)

## 2021-09-02 DIAGNOSIS — L821 Other seborrheic keratosis: Secondary | ICD-10-CM | POA: Diagnosis not present

## 2021-09-02 DIAGNOSIS — L820 Inflamed seborrheic keratosis: Secondary | ICD-10-CM | POA: Diagnosis not present

## 2021-09-02 DIAGNOSIS — L578 Other skin changes due to chronic exposure to nonionizing radiation: Secondary | ICD-10-CM | POA: Diagnosis not present

## 2021-09-02 DIAGNOSIS — L570 Actinic keratosis: Secondary | ICD-10-CM | POA: Diagnosis not present

## 2021-10-03 DIAGNOSIS — E1121 Type 2 diabetes mellitus with diabetic nephropathy: Secondary | ICD-10-CM | POA: Diagnosis not present

## 2021-10-03 DIAGNOSIS — J449 Chronic obstructive pulmonary disease, unspecified: Secondary | ICD-10-CM | POA: Diagnosis not present

## 2021-10-03 DIAGNOSIS — N183 Chronic kidney disease, stage 3 unspecified: Secondary | ICD-10-CM | POA: Diagnosis not present

## 2021-10-03 DIAGNOSIS — E782 Mixed hyperlipidemia: Secondary | ICD-10-CM | POA: Diagnosis not present

## 2021-11-01 DIAGNOSIS — R051 Acute cough: Secondary | ICD-10-CM | POA: Diagnosis not present

## 2021-11-01 DIAGNOSIS — J069 Acute upper respiratory infection, unspecified: Secondary | ICD-10-CM | POA: Diagnosis not present

## 2021-11-01 DIAGNOSIS — E1165 Type 2 diabetes mellitus with hyperglycemia: Secondary | ICD-10-CM | POA: Diagnosis not present

## 2021-11-04 ENCOUNTER — Ambulatory Visit (HOSPITAL_COMMUNITY)
Admission: RE | Admit: 2021-11-04 | Discharge: 2021-11-04 | Disposition: A | Discharge status: Home / Self Care | Payer: Medicare Other | Source: Ambulatory Visit | Attending: Cardiovascular Disease | Admitting: Cardiovascular Disease

## 2021-11-04 DIAGNOSIS — E785 Hyperlipidemia, unspecified: Secondary | ICD-10-CM | POA: Diagnosis not present

## 2021-11-04 MED ORDER — INCLISIRAN SODIUM 284 MG/1.5ML ~~LOC~~ SOSY: 284.0000 mg | PREFILLED_SYRINGE | Freq: Once | SUBCUTANEOUS | Status: AC

## 2021-11-04 MED ORDER — INCLISIRAN SODIUM 284 MG/1.5ML ~~LOC~~ SOSY
PREFILLED_SYRINGE | SUBCUTANEOUS | Status: AC
  Administered 2021-11-04: 284 mg via SUBCUTANEOUS
  Filled 2021-11-04: qty 1.5

## 2021-11-09 DIAGNOSIS — J209 Acute bronchitis, unspecified: Secondary | ICD-10-CM | POA: Diagnosis not present

## 2021-11-09 DIAGNOSIS — R051 Acute cough: Secondary | ICD-10-CM | POA: Diagnosis not present

## 2021-11-09 DIAGNOSIS — H449 Unspecified disorder of globe: Secondary | ICD-10-CM | POA: Diagnosis not present

## 2021-11-26 DIAGNOSIS — Z23 Encounter for immunization: Secondary | ICD-10-CM | POA: Diagnosis not present

## 2021-11-27 ENCOUNTER — Other Ambulatory Visit: Payer: Self-pay | Admitting: Cardiovascular Disease

## 2021-12-24 ENCOUNTER — Other Ambulatory Visit: Payer: Self-pay | Admitting: Cardiovascular Disease

## 2021-12-24 DIAGNOSIS — E1121 Type 2 diabetes mellitus with diabetic nephropathy: Secondary | ICD-10-CM | POA: Diagnosis not present

## 2021-12-24 DIAGNOSIS — N481 Balanitis: Secondary | ICD-10-CM | POA: Diagnosis not present

## 2021-12-24 DIAGNOSIS — N183 Chronic kidney disease, stage 3 unspecified: Secondary | ICD-10-CM | POA: Diagnosis not present

## 2021-12-24 DIAGNOSIS — Z6822 Body mass index (BMI) 22.0-22.9, adult: Secondary | ICD-10-CM | POA: Diagnosis not present

## 2021-12-28 ENCOUNTER — Other Ambulatory Visit: Payer: Self-pay | Admitting: Cardiovascular Disease

## 2022-01-09 ENCOUNTER — Ambulatory Visit (HOSPITAL_BASED_OUTPATIENT_CLINIC_OR_DEPARTMENT_OTHER)
Admission: RE | Admit: 2022-01-09 | Discharge: 2022-01-09 | Disposition: A | Discharge status: Home / Self Care | Payer: Medicare Other | Source: Ambulatory Visit | Attending: Cardiovascular Disease | Admitting: Cardiovascular Disease

## 2022-01-09 ENCOUNTER — Ambulatory Visit (HOSPITAL_COMMUNITY)
Admission: RE | Admit: 2022-01-09 | Discharge: 2022-01-09 | Disposition: A | Discharge status: Home / Self Care | Payer: Medicare Other | Source: Ambulatory Visit | Attending: Cardiovascular Disease | Admitting: Cardiovascular Disease

## 2022-01-09 DIAGNOSIS — I739 Peripheral vascular disease, unspecified: Secondary | ICD-10-CM | POA: Insufficient documentation

## 2022-01-09 DIAGNOSIS — Z95828 Presence of other vascular implants and grafts: Secondary | ICD-10-CM | POA: Insufficient documentation

## 2022-01-20 ENCOUNTER — Other Ambulatory Visit: Payer: Self-pay | Admitting: Cardiovascular Disease

## 2022-01-22 MED ORDER — CLOPIDOGREL BISULFATE 75 MG PO TABS: 75.0000 mg | ORAL_TABLET | Freq: Every day | ORAL | 11 refills | Status: AC

## 2022-02-17 DIAGNOSIS — N183 Chronic kidney disease, stage 3 unspecified: Secondary | ICD-10-CM | POA: Diagnosis not present

## 2022-02-17 DIAGNOSIS — J441 Chronic obstructive pulmonary disease with (acute) exacerbation: Secondary | ICD-10-CM | POA: Diagnosis not present

## 2022-02-17 DIAGNOSIS — F419 Anxiety disorder, unspecified: Secondary | ICD-10-CM | POA: Diagnosis not present

## 2022-02-17 DIAGNOSIS — E1121 Type 2 diabetes mellitus with diabetic nephropathy: Secondary | ICD-10-CM | POA: Diagnosis not present

## 2022-02-26 ENCOUNTER — Other Ambulatory Visit (HOSPITAL_BASED_OUTPATIENT_CLINIC_OR_DEPARTMENT_OTHER): Payer: Self-pay | Admitting: Cardiovascular Disease

## 2022-02-26 DIAGNOSIS — I739 Peripheral vascular disease, unspecified: Secondary | ICD-10-CM

## 2022-05-02 ENCOUNTER — Other Ambulatory Visit (HOSPITAL_COMMUNITY): Payer: Self-pay | Admitting: *Deleted

## 2022-05-05 ENCOUNTER — Ambulatory Visit (HOSPITAL_COMMUNITY)
Admission: RE | Admit: 2022-05-05 | Discharge: 2022-05-05 | Disposition: A | Discharge status: Home / Self Care | Payer: Medicare Other | Source: Ambulatory Visit | Attending: Cardiovascular Disease | Admitting: Cardiovascular Disease

## 2022-05-05 DIAGNOSIS — E785 Hyperlipidemia, unspecified: Secondary | ICD-10-CM | POA: Insufficient documentation

## 2022-05-05 DIAGNOSIS — I739 Peripheral vascular disease, unspecified: Secondary | ICD-10-CM | POA: Insufficient documentation

## 2022-05-05 MED ORDER — INCLISIRAN SODIUM 284 MG/1.5ML ~~LOC~~ SOSY
PREFILLED_SYRINGE | SUBCUTANEOUS | Status: AC
  Filled 2022-05-05: qty 1.5

## 2022-05-05 MED ORDER — INCLISIRAN SODIUM 284 MG/1.5ML ~~LOC~~ SOSY
284.0000 mg | PREFILLED_SYRINGE | Freq: Once | SUBCUTANEOUS | Status: AC
  Administered 2022-05-05: 284 mg via SUBCUTANEOUS

## 2022-06-09 ENCOUNTER — Telehealth: Payer: Self-pay | Admitting: Pharmacist

## 2022-06-09 DIAGNOSIS — E785 Hyperlipidemia, unspecified: Secondary | ICD-10-CM

## 2022-06-09 DIAGNOSIS — I739 Peripheral vascular disease, unspecified: Secondary | ICD-10-CM

## 2022-06-09 NOTE — Telephone Encounter (Signed)
Patient has been receiving Leqvio injections and requests a lab order. Lab order placed. Also reports he received a large bill from [facility]. Routed message to billing department.

## 2022-07-11 DIAGNOSIS — E1121 Type 2 diabetes mellitus with diabetic nephropathy: Secondary | ICD-10-CM | POA: Diagnosis not present

## 2022-07-11 DIAGNOSIS — J449 Chronic obstructive pulmonary disease, unspecified: Secondary | ICD-10-CM | POA: Diagnosis not present

## 2022-09-03 DIAGNOSIS — E1121 Type 2 diabetes mellitus with diabetic nephropathy: Secondary | ICD-10-CM | POA: Diagnosis not present

## 2022-09-03 DIAGNOSIS — E782 Mixed hyperlipidemia: Secondary | ICD-10-CM | POA: Diagnosis not present

## 2022-09-03 DIAGNOSIS — J449 Chronic obstructive pulmonary disease, unspecified: Secondary | ICD-10-CM | POA: Diagnosis not present

## 2022-09-03 DIAGNOSIS — Z79899 Other long term (current) drug therapy: Secondary | ICD-10-CM | POA: Diagnosis not present

## 2022-09-03 DIAGNOSIS — Z0000 Encounter for general adult medical examination without abnormal findings: Secondary | ICD-10-CM | POA: Diagnosis not present

## 2022-09-03 DIAGNOSIS — N183 Chronic kidney disease, stage 3 unspecified: Secondary | ICD-10-CM | POA: Diagnosis not present

## 2022-09-29 DIAGNOSIS — R6883 Chills (without fever): Secondary | ICD-10-CM | POA: Diagnosis not present

## 2022-09-29 DIAGNOSIS — M791 Myalgia, unspecified site: Secondary | ICD-10-CM | POA: Diagnosis not present

## 2022-09-29 DIAGNOSIS — R051 Acute cough: Secondary | ICD-10-CM | POA: Diagnosis not present

## 2022-09-29 DIAGNOSIS — R0981 Nasal congestion: Secondary | ICD-10-CM | POA: Diagnosis not present

## 2022-10-06 DIAGNOSIS — J22 Unspecified acute lower respiratory infection: Secondary | ICD-10-CM | POA: Diagnosis not present

## 2022-10-06 DIAGNOSIS — U071 COVID-19: Secondary | ICD-10-CM | POA: Diagnosis not present

## 2022-11-04 ENCOUNTER — Other Ambulatory Visit (HOSPITAL_COMMUNITY): Payer: Self-pay | Admitting: *Deleted

## 2022-11-04 ENCOUNTER — Other Ambulatory Visit (HOSPITAL_BASED_OUTPATIENT_CLINIC_OR_DEPARTMENT_OTHER): Payer: Self-pay | Admitting: Pharmacist Clinician (PhC)/ Clinical Pharmacy Specialist

## 2022-11-04 ENCOUNTER — Telehealth: Payer: Self-pay

## 2022-11-04 NOTE — Telephone Encounter (Signed)
Auth Submission: NO AUTH NEEDED Site of care: Site of care: [facility] Payer: Medicare A/B plus BCBS supplement Medication & CPT/J Code(s) submitted: Leqvio (Inclisiran) J1306 Route of submission (phone, fax, portal):  Phone # Fax # Auth type: Buy/Bill PB Units/visits requested: 284mg  x 2 doses Reference number:  Approval from: 11/04/22 to 03/13/23   Medicare A/B will cover 80%, BCBS supplement will cover the remaining 20%.

## 2022-11-05 ENCOUNTER — Ambulatory Visit (HOSPITAL_COMMUNITY)
Admission: RE | Admit: 2022-11-05 | Discharge: 2022-11-05 | Disposition: A | Discharge status: Home / Self Care | Payer: Medicare Other | Source: Ambulatory Visit | Attending: Cardiovascular Disease | Admitting: Cardiovascular Disease

## 2022-11-05 DIAGNOSIS — Z79899 Other long term (current) drug therapy: Secondary | ICD-10-CM | POA: Diagnosis not present

## 2022-11-05 DIAGNOSIS — E782 Mixed hyperlipidemia: Secondary | ICD-10-CM | POA: Insufficient documentation

## 2022-11-05 MED ORDER — INCLISIRAN SODIUM 284 MG/1.5ML ~~LOC~~ SOSY
284.0000 mg | PREFILLED_SYRINGE | Freq: Once | SUBCUTANEOUS | Status: AC
  Administered 2022-11-05: 284 mg via SUBCUTANEOUS

## 2022-11-05 MED ORDER — INCLISIRAN SODIUM 284 MG/1.5ML ~~LOC~~ SOSY
PREFILLED_SYRINGE | SUBCUTANEOUS | Status: AC
  Filled 2022-11-05: qty 1.5

## 2022-11-20 DIAGNOSIS — Z23 Encounter for immunization: Secondary | ICD-10-CM | POA: Diagnosis not present

## 2023-02-10 ENCOUNTER — Ambulatory Visit (HOSPITAL_COMMUNITY)
Admission: RE | Admit: 2023-02-10 | Discharge: 2023-02-10 | Disposition: A | Discharge status: Home / Self Care | Payer: Medicare Other | Source: Ambulatory Visit | Attending: Cardiovascular Disease | Admitting: Cardiovascular Disease

## 2023-02-10 ENCOUNTER — Encounter: Payer: Self-pay | Admitting: Cardiovascular Disease

## 2023-02-10 ENCOUNTER — Ambulatory Visit (HOSPITAL_BASED_OUTPATIENT_CLINIC_OR_DEPARTMENT_OTHER)
Admission: RE | Admit: 2023-02-10 | Discharge: 2023-02-10 | Disposition: A | Discharge status: Home / Self Care | Payer: Medicare Other | Source: Ambulatory Visit | Attending: Cardiovascular Disease | Admitting: Cardiovascular Disease

## 2023-02-10 DIAGNOSIS — I739 Peripheral vascular disease, unspecified: Secondary | ICD-10-CM

## 2023-02-10 DIAGNOSIS — Z95820 Peripheral vascular angioplasty status with implants and grafts: Secondary | ICD-10-CM

## 2023-02-10 LAB — VAS US ABI WITH/WO TBI
Left ABI: 1.09
Right ABI: 1.06

## 2023-03-03 DIAGNOSIS — L570 Actinic keratosis: Secondary | ICD-10-CM | POA: Diagnosis not present

## 2023-03-03 DIAGNOSIS — L578 Other skin changes due to chronic exposure to nonionizing radiation: Secondary | ICD-10-CM | POA: Diagnosis not present

## 2023-03-03 DIAGNOSIS — L821 Other seborrheic keratosis: Secondary | ICD-10-CM | POA: Diagnosis not present

## 2023-03-03 DIAGNOSIS — L820 Inflamed seborrheic keratosis: Secondary | ICD-10-CM | POA: Diagnosis not present

## 2023-03-03 DIAGNOSIS — C44529 Squamous cell carcinoma of skin of other part of trunk: Secondary | ICD-10-CM | POA: Diagnosis not present

## 2023-03-17 DIAGNOSIS — E782 Mixed hyperlipidemia: Secondary | ICD-10-CM | POA: Diagnosis not present

## 2023-03-17 DIAGNOSIS — N183 Chronic kidney disease, stage 3 unspecified: Secondary | ICD-10-CM | POA: Diagnosis not present

## 2023-03-17 DIAGNOSIS — Z6824 Body mass index (BMI) 24.0-24.9, adult: Secondary | ICD-10-CM | POA: Diagnosis not present

## 2023-03-17 DIAGNOSIS — E1121 Type 2 diabetes mellitus with diabetic nephropathy: Secondary | ICD-10-CM | POA: Diagnosis not present

## 2023-05-04 ENCOUNTER — Telehealth: Payer: Self-pay

## 2023-05-04 ENCOUNTER — Other Ambulatory Visit: Payer: Self-pay | Admitting: Pharmacist Clinician (PhC)/ Clinical Pharmacy Specialist

## 2023-05-04 NOTE — Telephone Encounter (Signed)
 Dr. Allyson Sabal and Belenda Cruise, patient will be scheduled as soon as possible.  Auth Submission: NO AUTH NEEDED Site of care: Site of care: [facility] Payer: Medicare A/B with BCBS supplement Medication & CPT/J Code(s) submitted: Leqvio (Inclisiran) J1306 Route of submission (phone, fax, portal):  Phone # Fax # Auth type: Buy/Bill PB Units/visits requested: 284mg  x 3 doses Reference number:  Approval from: 05/04/23 to 03/12/24

## 2023-05-05 ENCOUNTER — Ambulatory Visit (HOSPITAL_COMMUNITY)
Admission: RE | Admit: 2023-05-05 | Discharge: 2023-05-05 | Disposition: A | Discharge status: Home / Self Care | Payer: BLUE CROSS/BLUE SHIELD | Source: Ambulatory Visit | Attending: Cardiovascular Disease | Admitting: Cardiovascular Disease

## 2023-05-05 DIAGNOSIS — E785 Hyperlipidemia, unspecified: Secondary | ICD-10-CM | POA: Insufficient documentation

## 2023-05-05 MED ORDER — INCLISIRAN SODIUM 284 MG/1.5ML ~~LOC~~ SOSY
284.0000 mg | PREFILLED_SYRINGE | Freq: Once | SUBCUTANEOUS | Status: AC
  Administered 2023-05-05: 284 mg via SUBCUTANEOUS

## 2023-05-05 MED ORDER — INCLISIRAN SODIUM 284 MG/1.5ML ~~LOC~~ SOSY
PREFILLED_SYRINGE | SUBCUTANEOUS | Status: AC
  Filled 2023-05-05: qty 1.5

## 2023-07-14 DIAGNOSIS — L570 Actinic keratosis: Secondary | ICD-10-CM | POA: Diagnosis not present

## 2023-07-14 DIAGNOSIS — L821 Other seborrheic keratosis: Secondary | ICD-10-CM | POA: Diagnosis not present

## 2023-07-14 DIAGNOSIS — L820 Inflamed seborrheic keratosis: Secondary | ICD-10-CM | POA: Diagnosis not present

## 2023-07-14 DIAGNOSIS — C44529 Squamous cell carcinoma of skin of other part of trunk: Secondary | ICD-10-CM | POA: Diagnosis not present

## 2023-07-14 DIAGNOSIS — L578 Other skin changes due to chronic exposure to nonionizing radiation: Secondary | ICD-10-CM | POA: Diagnosis not present

## 2023-09-02 DIAGNOSIS — R252 Cramp and spasm: Secondary | ICD-10-CM | POA: Diagnosis not present

## 2023-09-02 DIAGNOSIS — N183 Chronic kidney disease, stage 3 unspecified: Secondary | ICD-10-CM | POA: Diagnosis not present

## 2023-09-02 DIAGNOSIS — Z6824 Body mass index (BMI) 24.0-24.9, adult: Secondary | ICD-10-CM | POA: Diagnosis not present

## 2023-09-02 DIAGNOSIS — E1121 Type 2 diabetes mellitus with diabetic nephropathy: Secondary | ICD-10-CM | POA: Diagnosis not present

## 2023-09-10 ENCOUNTER — Telehealth: Payer: Self-pay | Admitting: Pharmacist

## 2023-09-10 ENCOUNTER — Telehealth: Payer: Self-pay

## 2023-09-10 NOTE — Progress Notes (Signed)
   09/10/2023  Patient ID: Steven Perry, male   DOB: [birth year], 73 y.o.   MRN: [id number]  Met with patient face to face to review medications and complete patient assistance forms. Medication counseling provided to patient.   Application for Farxiga 5 mg 1 tablet PO daily and Breztri Areosphere 160-9-4.8 mcg/act. Samples provided to patient.   Annabella Galla, PharmD Clinical Pharmacist [facility] Direct Dial: [phone]

## 2023-09-10 NOTE — Telephone Encounter (Signed)
 PAP: Application for Breztri and Farxiga has been submitted to [employer] ([employer]), via fax

## 2023-09-11 NOTE — Telephone Encounter (Signed)
 PAP: Patient assistance application for Breztri and Farxiga has been approved by PAP Companies: [employer] from 09/11/2023 to 02/10/2024. Medication should be delivered to PAP Delivery: Home. For further shipping updates, please contact [employer] ([employer]) at [phone]. Patient ID is: [id number]

## 2023-09-11 NOTE — Progress Notes (Signed)
 Pharmacy Medication Assistance Program Note    09/11/2023  Patient ID: Steven Perry, male   DOB: 07/07/1950, 73 y.o.   MRN: [id number]     09/10/2023  Outreach Medication One  Initial Outreach Date (Medication One) 09/10/2023  Manufacturer Medication One [employer]  [employer] [facility] Farxiga  Dose of Breztri 160/9/4.8  Dose of Farxiga 5mg   Type of Radiographer, therapeutic Assistance  Name of Prescriber Tracey Bathe  Date Application Received From Patient 09/10/2023  Application Items Received From Patient Application  Date Application Received From Provider 09/10/2023  Date Application Submitted to Manufacturer 09/10/2023  Method Application Sent to Manufacturer Fax  Patient Assistance Determination Approved  Approval Start Date 09/11/2023  Approval End Date 02/10/2024  Patient Notification Method Telephone Call     Signature

## 2023-10-22 ENCOUNTER — Telehealth: Payer: Self-pay | Admitting: Pharmacy Technician

## 2023-10-22 NOTE — Telephone Encounter (Signed)
 Auth Submission: NO AUTH NEEDED Site of care: Site of care: [facility] Payer: MEDICARE A/B & BCBS SUPP Medication & CPT/J Code(s) submitted: Leqvio  (Inclisiran) J1306 Diagnosis Code: E78.5 Route of submission (phone, fax, portal):  Phone # Fax # Auth type: Buy/Bill HB Units/visits requested: 284MG  Q6 MONTHS Reference number:  Approval from: 10/22/23 to 03/12/24

## 2023-11-05 ENCOUNTER — Ambulatory Visit (HOSPITAL_COMMUNITY)
Admission: RE | Admit: 2023-11-05 | Discharge: 2023-11-05 | Disposition: A | Discharge status: Home / Self Care | Source: Ambulatory Visit | Attending: Cardiovascular Disease | Admitting: Cardiovascular Disease

## 2023-11-05 ENCOUNTER — Telehealth: Payer: Self-pay

## 2023-11-05 VITALS — BP 127/62 | HR 47 | Temp 97.6°F | Resp 16

## 2023-11-05 DIAGNOSIS — E785 Hyperlipidemia, unspecified: Secondary | ICD-10-CM | POA: Insufficient documentation

## 2023-11-05 MED ORDER — INCLISIRAN SODIUM 284 MG/1.5ML ~~LOC~~ SOSY
PREFILLED_SYRINGE | SUBCUTANEOUS | Status: AC
  Filled 2023-11-05: qty 1.5

## 2023-11-05 MED ORDER — INCLISIRAN SODIUM 284 MG/1.5ML ~~LOC~~ SOSY
284.0000 mg | PREFILLED_SYRINGE | Freq: Once | SUBCUTANEOUS | Status: AC
  Administered 2023-11-05: 284 mg via SUBCUTANEOUS

## 2023-11-05 NOTE — Telephone Encounter (Signed)
 2026 Renewal  PAP: Patient assistance application for Breztri and Farxiga through [employer] ([employer]) has been mailed to pt's home address on file. Provider portion of application will be faxed to provider's office.   Provider portion of application will be faxed to Dr. Tracey Bathe at [facility]

## 2023-11-16 DIAGNOSIS — E1121 Type 2 diabetes mellitus with diabetic nephropathy: Secondary | ICD-10-CM | POA: Diagnosis not present

## 2023-11-16 DIAGNOSIS — F419 Anxiety disorder, unspecified: Secondary | ICD-10-CM | POA: Diagnosis not present

## 2023-11-16 DIAGNOSIS — J449 Chronic obstructive pulmonary disease, unspecified: Secondary | ICD-10-CM | POA: Diagnosis not present

## 2023-11-16 DIAGNOSIS — Z23 Encounter for immunization: Secondary | ICD-10-CM | POA: Diagnosis not present

## 2023-11-16 DIAGNOSIS — N1832 Chronic kidney disease, stage 3b: Secondary | ICD-10-CM | POA: Diagnosis not present

## 2023-11-21 NOTE — Telephone Encounter (Signed)
 Received provider portion of patient assistance application for Breztri and Farxiga

## 2023-11-26 NOTE — Telephone Encounter (Signed)
 Attempted to call patient in regards to patient assistance application mailed 9/25.  Patient does not have voicemail set up

## 2024-01-01 NOTE — Telephone Encounter (Signed)
 PAP: Application for Breztri and Farxiga has been submitted to [employer] ([employer]), via fax

## 2024-01-06 NOTE — Telephone Encounter (Signed)
 PAP: Patient assistance application for Breztri and Farxiga has been approved by PAP Companies: [employer] from 02/11/2024 to 02/09/2025. Medication should be delivered to PAP Delivery: Home. For further shipping updates, please contact [employer] ([employer]) at [phone]. Patient ID is: [id number]

## 2024-01-12 DIAGNOSIS — L570 Actinic keratosis: Secondary | ICD-10-CM | POA: Diagnosis not present

## 2024-01-12 DIAGNOSIS — L720 Epidermal cyst: Secondary | ICD-10-CM | POA: Diagnosis not present

## 2024-01-12 DIAGNOSIS — L820 Inflamed seborrheic keratosis: Secondary | ICD-10-CM | POA: Diagnosis not present

## 2024-05-05 ENCOUNTER — Encounter (HOSPITAL_COMMUNITY)
